# Patient Record
Sex: Female | Born: 1980
Health system: Southern US, Community
[De-identification: ages and names within clinical notes are randomized; demographics above are authoritative.]

## PROBLEM LIST (undated history)

## (undated) DIAGNOSIS — B977 Papillomavirus as the cause of diseases classified elsewhere: Secondary | ICD-10-CM

## (undated) DIAGNOSIS — N879 Dysplasia of cervix uteri, unspecified: Secondary | ICD-10-CM

## (undated) DIAGNOSIS — D72819 Decreased white blood cell count, unspecified: Secondary | ICD-10-CM

## (undated) DIAGNOSIS — N3090 Cystitis, unspecified without hematuria: Secondary | ICD-10-CM

## (undated) DIAGNOSIS — B009 Herpesviral infection, unspecified: Secondary | ICD-10-CM

## (undated) DIAGNOSIS — J45909 Unspecified asthma, uncomplicated: Secondary | ICD-10-CM

## (undated) DIAGNOSIS — F329 Major depressive disorder, single episode, unspecified: Secondary | ICD-10-CM

## (undated) DIAGNOSIS — I10 Essential (primary) hypertension: Secondary | ICD-10-CM

## (undated) DIAGNOSIS — IMO0002 Reserved for concepts with insufficient information to code with codable children: Secondary | ICD-10-CM

## (undated) DIAGNOSIS — F32A Depression, unspecified: Secondary | ICD-10-CM

## (undated) DIAGNOSIS — K5909 Other constipation: Secondary | ICD-10-CM

## (undated) DIAGNOSIS — B379 Candidiasis, unspecified: Secondary | ICD-10-CM

## (undated) DIAGNOSIS — Z8619 Personal history of other infectious and parasitic diseases: Secondary | ICD-10-CM

## (undated) DIAGNOSIS — N871 Moderate cervical dysplasia: Secondary | ICD-10-CM

## (undated) HISTORY — DX: Personal history of other infectious and parasitic diseases: Z86.19

## (undated) HISTORY — DX: Moderate cervical dysplasia: N87.1

## (undated) HISTORY — DX: Unspecified asthma, uncomplicated: J45.909

## (undated) HISTORY — DX: Decreased white blood cell count, unspecified: D72.819

## (undated) HISTORY — DX: Major depressive disorder, single episode, unspecified: F32.9

## (undated) HISTORY — DX: Herpesviral infection, unspecified: B00.9

## (undated) HISTORY — DX: Depression, unspecified: F32.A

## (undated) HISTORY — DX: Dysplasia of cervix uteri, unspecified: N87.9

## (undated) HISTORY — DX: Candidiasis, unspecified: B37.9

## (undated) HISTORY — DX: Essential (primary) hypertension: I10

## (undated) HISTORY — DX: Papillomavirus as the cause of diseases classified elsewhere: B97.7

## (undated) HISTORY — DX: Cystitis, unspecified without hematuria: N30.90

## (undated) HISTORY — DX: Other constipation: K59.09

## (undated) HISTORY — DX: Reserved for concepts with insufficient information to code with codable children: IMO0002

---
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
## 1999-06-25 ENCOUNTER — Other Ambulatory Visit: Admission: RE | Admit: 1999-06-25 | Discharge: 1999-06-25 | Payer: Self-pay | Admitting: Obstetrics and Gynecology

## 1999-07-08 ENCOUNTER — Encounter: Admission: RE | Admit: 1999-07-08 | Discharge: 1999-07-08 | Payer: Self-pay | Admitting: Family Medicine

## 1999-07-08 ENCOUNTER — Encounter: Payer: Self-pay | Admitting: Family Medicine

## 1999-08-19 ENCOUNTER — Encounter (INDEPENDENT_AMBULATORY_CARE_PROVIDER_SITE_OTHER): Payer: Self-pay

## 1999-08-19 ENCOUNTER — Other Ambulatory Visit: Admission: RE | Admit: 1999-08-19 | Discharge: 1999-08-19 | Payer: Self-pay | Admitting: Obstetrics and Gynecology

## 2001-11-04 ENCOUNTER — Other Ambulatory Visit: Admission: RE | Admit: 2001-11-04 | Discharge: 2001-11-04 | Payer: Self-pay | Admitting: Obstetrics and Gynecology

## 2002-01-10 ENCOUNTER — Other Ambulatory Visit: Admission: RE | Admit: 2002-01-10 | Discharge: 2002-01-10 | Payer: Self-pay | Admitting: Obstetrics and Gynecology

## 2003-04-25 ENCOUNTER — Emergency Department (HOSPITAL_COMMUNITY): Admission: EM | Admit: 2003-04-25 | Discharge: 2003-04-25 | Payer: Self-pay | Admitting: Emergency Medicine

## 2004-09-28 DIAGNOSIS — IMO0002 Reserved for concepts with insufficient information to code with codable children: Secondary | ICD-10-CM

## 2004-09-28 DIAGNOSIS — R87619 Unspecified abnormal cytological findings in specimens from cervix uteri: Secondary | ICD-10-CM

## 2004-09-28 HISTORY — DX: Unspecified abnormal cytological findings in specimens from cervix uteri: R87.619

## 2004-09-28 HISTORY — DX: Reserved for concepts with insufficient information to code with codable children: IMO0002

## 2007-03-07 ENCOUNTER — Inpatient Hospital Stay (HOSPITAL_COMMUNITY): Admission: AD | Admit: 2007-03-07 | Discharge: 2007-03-07 | Payer: Self-pay | Admitting: Obstetrics & Gynecology

## 2007-05-21 ENCOUNTER — Encounter: Admission: RE | Admit: 2007-05-21 | Discharge: 2007-05-21 | Payer: Self-pay | Admitting: Family Medicine

## 2008-03-31 DIAGNOSIS — B009 Herpesviral infection, unspecified: Secondary | ICD-10-CM

## 2008-03-31 HISTORY — DX: Herpesviral infection, unspecified: B00.9

## 2008-05-29 DIAGNOSIS — N871 Moderate cervical dysplasia: Secondary | ICD-10-CM

## 2008-05-29 HISTORY — DX: Moderate cervical dysplasia: N87.1

## 2008-07-28 ENCOUNTER — Ambulatory Visit (HOSPITAL_COMMUNITY): Admission: RE | Admit: 2008-07-28 | Discharge: 2008-07-28 | Payer: Self-pay | Admitting: Obstetrics and Gynecology

## 2008-07-28 DIAGNOSIS — N879 Dysplasia of cervix uteri, unspecified: Secondary | ICD-10-CM

## 2008-07-28 DIAGNOSIS — B977 Papillomavirus as the cause of diseases classified elsewhere: Secondary | ICD-10-CM

## 2008-07-28 HISTORY — PX: LASER ABLATION OF THE CERVIX: SHX1949

## 2008-07-28 HISTORY — DX: Papillomavirus as the cause of diseases classified elsewhere: B97.7

## 2008-07-28 HISTORY — DX: Dysplasia of cervix uteri, unspecified: N87.9

## 2010-02-18 ENCOUNTER — Ambulatory Visit: Payer: Self-pay | Admitting: Oncology

## 2010-02-22 LAB — CHCC SMEAR

## 2010-02-22 LAB — COMPREHENSIVE METABOLIC PANEL
ALT: 16 U/L (ref 0–35)
AST: 21 U/L (ref 0–37)
Albumin: 4.1 g/dL (ref 3.5–5.2)
Alkaline Phosphatase: 44 U/L (ref 39–117)
BUN: 13 mg/dL (ref 6–23)
CO2: 25 mEq/L (ref 19–32)
Calcium: 9.1 mg/dL (ref 8.4–10.5)
Chloride: 102 mEq/L (ref 96–112)
Creatinine, Ser: 0.75 mg/dL (ref 0.40–1.20)
Glucose, Bld: 87 mg/dL (ref 70–99)
Potassium: 3.6 mEq/L (ref 3.5–5.3)
Sodium: 135 mEq/L (ref 135–145)
Total Bilirubin: 0.3 mg/dL (ref 0.3–1.2)
Total Protein: 9.3 g/dL — ABNORMAL HIGH (ref 6.0–8.3)

## 2010-02-22 LAB — CBC WITH DIFFERENTIAL/PLATELET
BASO%: 0.8 % (ref 0.0–2.0)
Basophils Absolute: 0 10*3/uL (ref 0.0–0.1)
EOS%: 1.1 % (ref 0.0–7.0)
Eosinophils Absolute: 0 10*3/uL (ref 0.0–0.5)
HCT: 34.8 % (ref 34.8–46.6)
HGB: 11.6 g/dL (ref 11.6–15.9)
LYMPH%: 41 % (ref 14.0–49.7)
MCH: 29.3 pg (ref 25.1–34.0)
MCHC: 33.3 g/dL (ref 31.5–36.0)
MCV: 88.1 fL (ref 79.5–101.0)
MONO#: 0.4 10*3/uL (ref 0.1–0.9)
MONO%: 13.5 % (ref 0.0–14.0)
NEUT#: 1.4 10*3/uL — ABNORMAL LOW (ref 1.5–6.5)
NEUT%: 43.6 % (ref 38.4–76.8)
Platelets: 220 10*3/uL (ref 145–400)
RBC: 3.95 10*6/uL (ref 3.70–5.45)
RDW: 13.8 % (ref 11.2–14.5)
WBC: 3.2 10*3/uL — ABNORMAL LOW (ref 3.9–10.3)
lymph#: 1.3 10*3/uL (ref 0.9–3.3)

## 2010-02-22 LAB — MORPHOLOGY
PLT EST: ADEQUATE
RBC Comments: NORMAL

## 2010-02-26 LAB — FOLATE: Folate: 14.1 ng/mL

## 2010-02-26 LAB — ANTI-NUCLEAR AB-TITER (ANA TITER)

## 2010-02-26 LAB — PROTEIN ELECTROPHORESIS, SERUM
Albumin ELP: 47.8 % — ABNORMAL LOW (ref 55.8–66.1)
Alpha-2-Globulin: 8.9 % (ref 7.1–11.8)
Total Protein, Serum Electrophoresis: 9 g/dL — ABNORMAL HIGH (ref 6.0–8.3)

## 2010-02-26 LAB — VITAMIN B12: Vitamin B-12: 335 pg/mL (ref 211–911)

## 2010-02-26 LAB — IGG, IGA, IGM: IgA: 256 mg/dL (ref 68–378)

## 2010-02-26 LAB — TSH: TSH: 3.631 u[IU]/mL (ref 0.350–4.500)

## 2010-07-10 LAB — PREGNANCY, URINE: Preg Test, Ur: NEGATIVE

## 2010-07-10 LAB — URINALYSIS, ROUTINE W REFLEX MICROSCOPIC
Bilirubin Urine: NEGATIVE
Leukocytes, UA: NEGATIVE
Nitrite: NEGATIVE
Specific Gravity, Urine: 1.01 (ref 1.005–1.030)
pH: 8 (ref 5.0–8.0)

## 2010-07-10 LAB — URINE MICROSCOPIC-ADD ON

## 2010-07-10 LAB — CBC
Hemoglobin: 12.3 g/dL (ref 12.0–15.0)
MCV: 89 fL (ref 78.0–100.0)
RBC: 4.17 MIL/uL (ref 3.87–5.11)
WBC: 2.4 10*3/uL — ABNORMAL LOW (ref 4.0–10.5)

## 2010-08-13 NOTE — Op Note (Signed)
Michelle Wise, Michelle Wise             ACCOUNT NO.:  [id number]   MEDICAL RECORD NO.:  [id number]          PATIENT TYPE:  AMB   LOCATION:  SDC                           FACILITY:  [facility]   PHYSICIAN:  Janine Limbo, M.D.DATE OF BIRTH:  [birth year]   DATE OF PROCEDURE:  07/28/2008  DATE OF DISCHARGE:                               OPERATIVE REPORT   PREOPERATIVE DIAGNOSES:  1. Cervical intraepithelial neoplasia.  2. Human papillomavirus.   POSTOPERATIVE DIAGNOSES:  1. Cervical intraepithelial neoplasia.  2. Human papillomavirus   PROCEDURE:  Laser ablation of the cervix.   SURGEON:  Janine Limbo, MD   FIRST ASSISTANT:  None.   ANESTHETIC:  General.   DISPOSITION:  Ms. Hochman is a 30 year old female, para 0-0-1-0, who  presents with the above-mentioned diagnosis.  The patient understands  the indications for her surgical procedure and she also understands the  alternative treatment options.  She accepts the risks of, but not  limited to, anesthetic complications, bleeding, infections, and possible  damage to the surrounding organs.   FINDINGS:  The patient had a large nonstaining area on the exocervix.  The endocervical canal was clean.  No pelvic masses were appreciated on  examination under anesthesia.   PROCEDURE:  The patient was taken to the operating room where a general  anesthetic was given.  The patient's lower abdomen, perineum, and vagina  were prepped with multiple layers of Betadine.  The bladder was drained  of urine.  The patient was sterilely draped.  Examination under  anesthesia was performed.  The cervix was then coated with acetic acid.  Lugol solution was then placed on the cervix and in the vagina.  A large  nonstaining area was appreciated on the cervix.  The area encircled the  cervical os.  A paracervical block was then placed using 10 mL of 0.5%  Marcaine with epinephrine.  An additional 10 mL of 0.5% Marcaine with  epinephrine were injected  directly into the cervix.  The laser equipment  was then tested and was noted to be functioning properly.  A defocused  beam of the laser was used to outline the nonstaining area.  The  nonstaining area was then encircled using a focused beam of the laser.  We then used the laser to ablate the surface of the cervix removing all  nonstaining areas.  Hemostasis was noted to be adequate.  The  endocervical canal was sounded and was noted to be patent.  The cervix  was carefully inspected as well as the vagina.  There was no evidence of  any other nonstaining areas.  We felt that we were ready to terminate  our procedure.  The patient was given Toradol 30 mg intramuscularly and  30 mg intravenously at the end of the procedure.  All instruments were  removed.  The patient was awakened from her anesthetic without  difficulty and then transported to the recovery room in stable  condition.  Sponge, needle, and instrument counts were correct.  The  estimated blood loss was 5 mL.   FOLLOWUP INSTRUCTIONS:  The patient will return to  see Dr. Stefano Gaul in 2  weeks for followup examination.  She was given a copy of the  postoperative instruction sheet as prepared by the [facility] for patients who had a dilatation and curettage, but was then  modified for laser of the cervix.  She was given a prescription for  Motrin and she will take 800 mg every 8 hours as needed for mild-to-  moderate pain.  She was given a prescription for Vicodin and she will  take 1 or 2 tablets every 4 hours as needed for severe pain.  She was  given a prescription for Zofran and she will take 8 mg every 8 hours as  needed for nausea.       Janine Limbo, M.D.  Electronically Signed     AVS/MEDQ  D:  07/28/2008  T:  07/29/2008  Job:  [id number]

## 2010-08-13 NOTE — Consult Note (Signed)
NAMEVILLA, BURGIN             ACCOUNT NO.:  [id number]   MEDICAL RECORD NO.:  [id number]          PATIENT TYPE:  AMB   LOCATION:  SDC                           [facility]ACILITY:  [facility]   PHYSICIAN:  Janine Limbo, M.D.DATE OF BIRTH:  1980-12-23   DATE OF CONSULTATION:  07/27/2008  DATE OF DISCHARGE:                                 CONSULTATION   HISTORY OF PRESENT ILLNESS:  Ms. Charette is a 30 year old female, para  0 0 1 0, who presents for laser ablation of the cervix.  The patient has  been followed at the [facility]  [facility].  She had a Pap smear which showed  atypical squamous cells of undetermined significance.  The human  papilloma virus test was positive.  Colposcopy and biopsies were  performed in March 2010 and the biopsy returned showing cervical  intraepithelial neoplasia II.   OBSTETRICAL HISTORY:  The the patient has had 1 first trimester  pregnancy termination.   PAST MEDICAL HISTORY:  The patient has a history of surgical repair of  her lip because of a cleft.  This was done as a child.  The patient also  has a history of hypertension, and she is currently being treated.  She  has a history of asthma but is doing well.   DRUG ALLERGIES:  No known drug allergies.   SOCIAL HISTORY:  The patient drinks alcohol socially.  She denies  cigarette use and recreational drug uses.   REVIEW OF SYSTEMS:  Noncontributory.   FAMILY HISTORY:  Noncontributory.   PHYSICAL EXAM:  VITAL SIGNS:  Weight is 149 pounds.  Her height is 5  feet 3 inches.  HEENT:  Within normal limits.  CHEST:  Clear.  HEART:  Regular rate and rhythm.  BREASTS: Without masses.  ABDOMEN: Nontender.  EXTREMITIES:  Grossly normal.  NEUROLOGIC: Exam is grossly normal.  PELVIC: External genitalia is normal except for hypopigmentation on the  external genitalia.  Vagina is normal.  The cervix is nontender.  The  uterus is normal size,  shape and consistency.  Adnexa no masses.   ASSESSMENT:  1. Cervical intraepithelial neoplasia II.  2. Human papilloma virus   PLAN:  The patient will undergo a laser ablation of the cervix.  She  understands the indications for surgical procedure and she accepts the  risks of, but not limited to, anesthetic complications, bleeding,  infection, and possible damage to the surrounding organs.      Janine Limbo, M.D.  Electronically Signed     AVS/MEDQ  D:  07/27/2008  T:  07/27/2008  Job:  [id number]

## 2010-12-11 ENCOUNTER — Other Ambulatory Visit: Payer: Self-pay | Admitting: Oncology

## 2010-12-11 ENCOUNTER — Encounter (HOSPITAL_BASED_OUTPATIENT_CLINIC_OR_DEPARTMENT_OTHER): Payer: Federal, State, Local not specified - PPO | Admitting: Oncology

## 2010-12-11 DIAGNOSIS — D72819 Decreased white blood cell count, unspecified: Secondary | ICD-10-CM

## 2010-12-11 LAB — CBC WITH DIFFERENTIAL/PLATELET
Basophils Absolute: 0 10*3/uL (ref 0.0–0.1)
EOS%: 0.9 % (ref 0.0–7.0)
HCT: 34.5 % — ABNORMAL LOW (ref 34.8–46.6)
HGB: 11.6 g/dL (ref 11.6–15.9)
MCH: 28.9 pg (ref 25.1–34.0)
NEUT%: 63 % (ref 38.4–76.8)
lymph#: 0.7 10*3/uL — ABNORMAL LOW (ref 0.9–3.3)

## 2010-12-11 LAB — COMPREHENSIVE METABOLIC PANEL
Albumin: 4.2 g/dL (ref 3.5–5.2)
Alkaline Phosphatase: 49 U/L (ref 39–117)
BUN: 13 mg/dL (ref 6–23)
Calcium: 9.3 mg/dL (ref 8.4–10.5)
Chloride: 106 mEq/L (ref 96–112)
Glucose, Bld: 79 mg/dL (ref 70–99)
Potassium: 4.1 mEq/L (ref 3.5–5.3)

## 2011-01-06 LAB — URINALYSIS, ROUTINE W REFLEX MICROSCOPIC
Nitrite: POSITIVE — AB
Specific Gravity, Urine: 1.015
Urobilinogen, UA: 0.2

## 2011-01-06 LAB — URINE MICROSCOPIC-ADD ON

## 2011-01-31 ENCOUNTER — Encounter: Payer: Self-pay | Admitting: Oncology

## 2011-01-31 DIAGNOSIS — D72819 Decreased white blood cell count, unspecified: Secondary | ICD-10-CM | POA: Insufficient documentation

## 2011-01-31 DIAGNOSIS — F32A Depression, unspecified: Secondary | ICD-10-CM | POA: Insufficient documentation

## 2011-01-31 DIAGNOSIS — I10 Essential (primary) hypertension: Secondary | ICD-10-CM | POA: Insufficient documentation

## 2011-01-31 DIAGNOSIS — F329 Major depressive disorder, single episode, unspecified: Secondary | ICD-10-CM | POA: Insufficient documentation

## 2011-02-14 ENCOUNTER — Other Ambulatory Visit: Payer: Federal, State, Local not specified - PPO | Admitting: Lab

## 2011-02-14 ENCOUNTER — Ambulatory Visit: Payer: Federal, State, Local not specified - PPO | Admitting: Oncology

## 2011-04-14 ENCOUNTER — Other Ambulatory Visit: Payer: Federal, State, Local not specified - PPO | Admitting: Lab

## 2011-08-13 ENCOUNTER — Other Ambulatory Visit: Payer: Federal, State, Local not specified - PPO | Admitting: Lab

## 2011-08-13 ENCOUNTER — Ambulatory Visit: Payer: Federal, State, Local not specified - PPO | Admitting: Oncology

## 2011-09-01 ENCOUNTER — Telehealth: Payer: Self-pay | Admitting: Obstetrics and Gynecology

## 2011-09-01 NOTE — Telephone Encounter (Signed)
TC from pt.   States is having vag irritation with slight itching.   Had spotting 08/27/11.   Sched for eval with DR AVS 09/02/11.

## 2011-09-01 NOTE — Telephone Encounter (Signed)
Triage/epic 

## 2011-09-02 ENCOUNTER — Encounter: Payer: Self-pay | Admitting: Obstetrics and Gynecology

## 2011-09-02 ENCOUNTER — Ambulatory Visit (INDEPENDENT_AMBULATORY_CARE_PROVIDER_SITE_OTHER): Payer: Federal, State, Local not specified - PPO | Admitting: Obstetrics and Gynecology

## 2011-09-02 ENCOUNTER — Telehealth: Payer: Self-pay | Admitting: Obstetrics and Gynecology

## 2011-09-02 VITALS — BP 116/70 | Resp 16 | Ht 63.0 in | Wt 150.0 lb

## 2011-09-02 DIAGNOSIS — E663 Overweight: Secondary | ICD-10-CM

## 2011-09-02 DIAGNOSIS — N760 Acute vaginitis: Secondary | ICD-10-CM

## 2011-09-02 DIAGNOSIS — Q369 Cleft lip, unilateral: Secondary | ICD-10-CM

## 2011-09-02 DIAGNOSIS — A630 Anogenital (venereal) warts: Secondary | ICD-10-CM

## 2011-09-02 DIAGNOSIS — A499 Bacterial infection, unspecified: Secondary | ICD-10-CM

## 2011-09-02 DIAGNOSIS — B9689 Other specified bacterial agents as the cause of diseases classified elsewhere: Secondary | ICD-10-CM

## 2011-09-02 DIAGNOSIS — N871 Moderate cervical dysplasia: Secondary | ICD-10-CM

## 2011-09-02 DIAGNOSIS — N898 Other specified noninflammatory disorders of vagina: Secondary | ICD-10-CM

## 2011-09-02 DIAGNOSIS — B977 Papillomavirus as the cause of diseases classified elsewhere: Secondary | ICD-10-CM

## 2011-09-02 DIAGNOSIS — B009 Herpesviral infection, unspecified: Secondary | ICD-10-CM

## 2011-09-02 LAB — POCT WET PREP (WET MOUNT): Clue Cells Wet Prep Whiff POC: POSITIVE

## 2011-09-02 MED ORDER — FLUCONAZOLE 150 MG PO TABS: 150.0000 mg | ORAL_TABLET | Freq: Every day | ORAL | Status: AC

## 2011-09-02 MED ORDER — METRONIDAZOLE 0.75 % VA GEL: 1.0000 | Freq: Two times a day (BID) | VAGINAL | Status: AC

## 2011-09-02 NOTE — Telephone Encounter (Signed)
Michelle Wise/AVS appt today

## 2011-09-02 NOTE — Progress Notes (Signed)
Color: Yellow Odor: no Itching:yes Thin:yes Thick:no Fever:no Dyspareunia:yes Hx PID:no HX STD:yes Pelvic Pain:no Desires Gc/CT:no Desires HIV,RPR,HbsAG:no  LC  Michelle Wise is a 31 y.o. year old female,No obstetric history on file., who presents for a problem visit. She has a history of herpes virus, and HPV.  Subjective:  The patient complains of a vaginal discharge with irritation of the vulva. She complains of dyspareunia.  Objective:  BP 116/70  Resp 16  Ht 5\' 3"  (1.6 m)  Wt 150 lb (68.04 kg)  BMI 26.57 kg/m2  LMP 08/13/2011   GI: soft, non-tender; bowel sounds normal; no masses,  no organomegaly  External genitalia: normal general appearance Vaginal: normal mucosa without prolapse or lesions Cervix: normal appearance Adnexa: normal bimanual exam Uterus: normal size shape and consistency  Wet prep: Clue cells and yeast  Assessment:  Vaginal discharge Vaginosis Yeast vaginitis Dyspareunia Herpes virus 2 HPV  Plan:  MetroGel for 5 nights Diflucan 150 mg x1 GC and Chlamydia sent  Return to office in 3 week(s) for annual exam.   Leonard Schwartz M.D.  09/02/2011 7:17 PM

## 2011-09-02 NOTE — Patient Instructions (Signed)
Vaginitis Vaginitis is an infection. It causes soreness, swelling, and redness (inflammation) of the vagina. Many of these infections are sexually transmitted diseases (STDs). Having unprotected sex can cause further problems and complications such as:  Chronic pelvic pain.   Infertility.   Unwanted pregnancy.   Abortion.   Tubal pregnancy.   Infection passed on to the newborn.   Cancer.  CAUSES   Monilia. This is a yeast or fungus infection, not an STD.   Bacterial vaginosis. The normal balance of bacteria in the vagina is disrupted and is replaced by an overgrowth of certain bacteria.   Gonorrhea, chlamydia. These are bacterial infections that are STDs.   Vaginal sponges, diaphragms, and intrauterine devices.   Trichomoniasis. This is a STD infection caused by a parasite.   Viruses like herpes and human papillomavirus. Both are STDs.   Pregnancy.   Immunosuppression. This occurs with certain conditions such as HIV infection or cancer.   Using bubble bath.   Taking certain antibiotic medicines.   Sporadic recurrence can occur if you become sick.   Diabetes.   Steroids.   Allergic reaction. If you have an allergy to:   Douches.   Soaps.   Spermicides.   Condoms.   Scented tampons or vaginal sprays.  SYMPTOMS   Abnormal vaginal discharge.   Itching of the vagina.   Pain in the vagina.   Swelling of the vagina.  In some cases, there are no symptoms. TREATMENT  Treatment will vary depending on the type of infection.  Bacteria or trichomonas are usually treated with oral antibiotics and sometimes vaginal cream or suppositories.   Monilia vaginitis is usually treated with vaginal creams, suppositories, or oral antifungal pills.   Viral vaginitis has no cure. However, the symptoms of herpes (a viral vaginitis) can be treated to relieve the discomfort. Human papillomavirus has no symptoms. However, there are treatments for the diseases caused by human  papillomavirus.   With allergic vaginitis, you need to stop using the product that is causing the problem. Vaginal creams can be used to treat the symptoms.   When treating an STD, the sex partner should also be treated.  HOME CARE INSTRUCTIONS   Take all the medicines as directed by your caregiver.   Do not use scented tampons, soaps, or vaginal sprays.   Do not douche.   Tell your sex partner if you have a vaginal infection or an STD.   Do not have sexual intercourse until you have treated the vaginitis.   Practice safe sex by using condoms.  SEEK MEDICAL CARE IF:   You have abdominal pain.   Your symptoms get worse during treatment.  Document Released: 01/12/2007 Document Revised: 03/06/2011 Document Reviewed: 09/07/2008 ExitCare Patient Information 2012 [employer]. 

## 2011-09-03 ENCOUNTER — Telehealth: Payer: Self-pay

## 2011-09-03 LAB — GC/CHLAMYDIA PROBE AMP, GENITAL: Chlamydia, DNA Probe: NEGATIVE

## 2011-09-03 NOTE — Telephone Encounter (Signed)
Pt picked Rx up today. [facility] CMA

## 2011-09-03 NOTE — Telephone Encounter (Signed)
Per Michelle Wise pt called today  and wants to know why her Rx has not been called in.  I called [facility] on [address] and Rx was eprescribed yesterdays pharm stated pt has not picked up Rx and may be calling wrong pharm.[facility] CMA

## 2011-09-03 NOTE — Telephone Encounter (Signed)
Michelle Wise/AVS pt/was seen yesterday

## 2011-09-25 ENCOUNTER — Encounter: Payer: Self-pay | Admitting: Obstetrics and Gynecology

## 2011-09-25 ENCOUNTER — Ambulatory Visit (INDEPENDENT_AMBULATORY_CARE_PROVIDER_SITE_OTHER): Payer: Federal, State, Local not specified - PPO | Admitting: Obstetrics and Gynecology

## 2011-09-25 VITALS — BP 104/76 | Resp 14 | Ht 63.0 in | Wt 146.0 lb

## 2011-09-25 DIAGNOSIS — M3500 Sicca syndrome, unspecified: Secondary | ICD-10-CM

## 2011-09-25 DIAGNOSIS — Z01419 Encounter for gynecological examination (general) (routine) without abnormal findings: Secondary | ICD-10-CM

## 2011-09-25 DIAGNOSIS — Z124 Encounter for screening for malignant neoplasm of cervix: Secondary | ICD-10-CM

## 2011-09-25 NOTE — Progress Notes (Signed)
Regular Periods: yes Mammogram: no  Monthly Breast Ex.: no Exercise: yes  Tetanus < 10 years: yes Seatbelts: yes  NI. Bladder Functn.: yes Abuse at home: no  Daily BM's: no Stressful Work: yes  Healthy Diet: yes Sigmoid-Colonoscopy: n/a  Calcium: no Medical problems this year: Migraines, Sjogren's Syndrome "10/12"   LAST PAP:08/05/2010 "reactive reparative cellular changes are present / BV"  Contraception: N/A  Mammogram: N/A  PCP: Dr[facility]  PMH: Diagnosed Sjogren's Syndrome in Oct 2010  [facility]: No Changes  Last Bone Scan: N/A  ANNUAL GYNECOLOGIC EXAMINATION   GENEVER HENTGES is a 31 y.o. female, G1P1, who presents for an annual exam. See above. The patient has a history of CIN-2 that was treated with laser ablation.  Gonorrhea and Chlamydia tests were negative 2 weeks ago. She was treated for vaginosis.  Her symptoms have resolved.  The patient has a history of herpes.  She does not complain of outbreaks.  Prior Hysterectomy: No    History   Social History  . Marital Status: Single    Spouse Name: N/A    Number of Children: N/A  . Years of Education: N/A   Social History Main Topics  . Smoking status: Never Smoker   . Smokeless tobacco: None  . Alcohol Use: Yes  . Drug Use: No  . Sexually Active: Yes    Birth Control/ Protection: Condom   Other Topics Concern  . None   Social History Narrative  . None    Menstrual cycle:   LMP: Patient's last menstrual period was 09/13/2011.           Cycle: Regular, monthly with normal flow and no severe dysmenorrha  The following portions of the patient's history were reviewed and updated as appropriate: allergies, current medications, past family history, past medical history, past social history, past surgical history and problem list.  Review of Systems Pertinent items are noted in HPI. Breast:Negative for breast lump,nipple discharge or nipple retraction Gastrointestinal: Negative for  abdominal pain, change in bowel habits or rectal bleeding Urinary:negative   Objective:    BP 104/76  Resp 14  Ht 5\' 3"  (1.6 m)  Wt 146 lb (66.225 kg)  BMI 25.86 kg/m2  LMP 09/13/2011    Weight:  Wt Readings from Last 1 Encounters:  09/25/11 146 lb (66.225 kg)          BMI: Body mass index is 25.86 kg/(m^2).  General Appearance: Alert, appropriate appearance for age. No acute distress HEENT: Grossly normal Neck / Thyroid: Supple, no masses, nodes or enlargement Lungs: clear to auscultation bilaterally Back: No CVA tenderness Breast Exam: No masses or nodes.No dimpling, nipple retraction or discharge. Cardiovascular: Regular rate and rhythm. S1, S2, no murmur Gastrointestinal: Soft, non-tender, no masses or organomegaly  ++++++++++++++++++++++++++++++++++++++++++++++++++++++++  Pelvic Exam: External genitalia: normal general appearance Vaginal: normal without tenderness, induration or masses and relaxation: Yes Cervix: normal appearance Adnexa: normal bimanual exam Uterus: normal size, shape, and consistency Rectovaginal: deferred  ++++++++++++++++++++++++++++++++++++++++++++++++++++++++  Lymphatic Exam: Non-palpable nodes in neck, clavicular, axillary, or inguinal regions Neurologic: Normal speech, no tremor  Psychiatric: Alert and oriented, appropriate affect.   Wet Prep:   not applicable Urinalysis:  not applicable UPT:           Not done   Assessment:    Normal gyn exam   Overweight or obese: No   Pelvic relaxation: Yes  Sjgren syndrome  Herpes virus  Resolved vaginosis  History of CIN-2 status post laser  ablation of the cervix   Plan:    pap smear return annually or prn Contraception:condoms    Medications prescribed: none  STD screen request: none  RPR: No.   HBsAg: No.  Hepatitis C: No.  The updated Pap smear screening guidelines were discussed with the patient. The patient requested that I obtain a Pap smear: Yes.  Kegel exercises  discussed: Yes.  Proper diet and regular exercise were reviewed.  Annual mammograms recommended starting at age 34. Proper breast care was discussed.  Screening colonoscopy is recommended beginning at age 7.  Regular health maintenance was reviewed.  Sleep hygiene was discussed.  Adequate calcium and vitamin D intake was emphasized.  Mylinda Latina.D.

## 2011-09-26 DIAGNOSIS — M3500 Sicca syndrome, unspecified: Secondary | ICD-10-CM | POA: Insufficient documentation

## 2011-09-29 LAB — PAP IG W/ RFLX HPV ASCU

## 2014-01-30 ENCOUNTER — Encounter: Payer: Self-pay | Admitting: Obstetrics and Gynecology

## 2014-03-10 ENCOUNTER — Telehealth: Payer: Self-pay | Admitting: Internal Medicine

## 2014-03-10 NOTE — Telephone Encounter (Signed)
S/W PATIENT FATHER AND GAVE NP APPT FOR 12/15 @ 1:45 W/DR. MOHAMED REFERRING DR. Nickola MajorHAWKES DX- LOW WBC

## 2014-03-13 ENCOUNTER — Other Ambulatory Visit: Payer: Self-pay | Admitting: *Deleted

## 2014-03-13 ENCOUNTER — Inpatient Hospital Stay (HOSPITAL_COMMUNITY)
Admission: AD | Admit: 2014-03-13 | Discharge: 2014-03-13 | Disposition: A | Discharge status: Home / Self Care | Payer: Federal, State, Local not specified - PPO | Source: Ambulatory Visit | Attending: Obstetrics and Gynecology | Admitting: Obstetrics and Gynecology

## 2014-03-13 ENCOUNTER — Encounter (HOSPITAL_COMMUNITY): Payer: Self-pay

## 2014-03-13 DIAGNOSIS — N949 Unspecified condition associated with female genital organs and menstrual cycle: Secondary | ICD-10-CM | POA: Insufficient documentation

## 2014-03-13 DIAGNOSIS — I10 Essential (primary) hypertension: Secondary | ICD-10-CM | POA: Insufficient documentation

## 2014-03-13 DIAGNOSIS — K5900 Constipation, unspecified: Secondary | ICD-10-CM | POA: Insufficient documentation

## 2014-03-13 DIAGNOSIS — R102 Pelvic and perineal pain: Secondary | ICD-10-CM

## 2014-03-13 DIAGNOSIS — D72819 Decreased white blood cell count, unspecified: Secondary | ICD-10-CM

## 2014-03-13 DIAGNOSIS — R109 Unspecified abdominal pain: Secondary | ICD-10-CM | POA: Diagnosis present

## 2014-03-13 LAB — WET PREP, GENITAL
Clue Cells Wet Prep HPF POC: NONE SEEN
TRICH WET PREP: NONE SEEN
YEAST WET PREP: NONE SEEN

## 2014-03-13 LAB — URINALYSIS, ROUTINE W REFLEX MICROSCOPIC
Bilirubin Urine: NEGATIVE
Glucose, UA: NEGATIVE mg/dL
HGB URINE DIPSTICK: NEGATIVE
Ketones, ur: NEGATIVE mg/dL
Leukocytes, UA: NEGATIVE
NITRITE: NEGATIVE
PH: 6 (ref 5.0–8.0)
Protein, ur: NEGATIVE mg/dL
SPECIFIC GRAVITY, URINE: 1.025 (ref 1.005–1.030)
UROBILINOGEN UA: 0.2 mg/dL (ref 0.0–1.0)

## 2014-03-13 LAB — POCT PREGNANCY, URINE: PREG TEST UR: NEGATIVE

## 2014-03-13 MED ORDER — KETOROLAC TROMETHAMINE 60 MG/2ML IM SOLN
60.0000 mg | Freq: Once | INTRAMUSCULAR | Status: AC
  Administered 2014-03-13: 60 mg via INTRAMUSCULAR
  Filled 2014-03-13: qty 2

## 2014-03-13 NOTE — Discharge Instructions (Signed)
Mittelschmerz       Mittelschmerz is lower belly (abdominal) pain that happens between your periods (menstrual periods). The pain may be felt right before, during, or after your ovary releases an egg (ovulation).   HOME CARE   Only take medicines as told by your doctor. Do not take aspirin.   Write down when the pain starts. Write down how bad it is, if you had a fever with the pain, and how long the pain lasted.  GET HELP RIGHT AWAY IF:   You have more pain and medicine does not help.   You start bleeding from your vagina (more than spots of blood) and have pain.   You feel sick to your stomach (nauseous) and throw up (vomit).   You have a fever.   You feel lightheaded and pass out (faint).  MAKE SURE YOU:   Understand these instructions.   Will watch your condition.   Will get help right away if you are not doing well or get worse.  Document Released: 04/24/2004 Document Revised: 06/09/2011 Document Reviewed: 07/15/2010   ExitCare® Patient Information ©2015 [employer]. This information is not intended to replace advice given to you by your health care provider. Make sure you discuss any questions you have with your health care provider.

## 2014-03-13 NOTE — MAU Provider Note (Signed)
History     CSN: [id number]  Arrival date and time: 03/13/14 [id number]   First Provider Initiated Contact with Patient 03/13/14 0520      Chief Complaint  Patient presents with  . Abdominal Cramping   HPI  Michelle Wise is a 33 y.o. a G1P0010 who presents today with cramping. She states that around 0300 she was having intercourse. She had pain after intercourse that she thinks feels like a UTI. She denies any vaginal discharge. She states that she took aleve when the pain started, but it has not helped. LMP 02/27/14   Past Medical History  Diagnosis Date  . Depression   . Hypertension   . Leukocytopenia   . Asthma   . Chronic constipation   . HPV (human papilloma virus) infection 07/28/2008  . Cervical intraepithelial neoplasia 07/28/2008  . H/O varicella   . Herpesviral infection   . Yeast infection   . Bladder infection   . Abnormal Pap smear 2006-7  . Herpes simplex type 2 infection 2010  . CIN II (cervical intraepithelial neoplasia II) 05/2008    Past Surgical History  Procedure Laterality Date  . Laser ablation of the cervix  07/28/2008    Family History  Problem Relation Age of Onset  . Hypertension Mother     History  Substance Use Topics  . Smoking status: Never Smoker   . Smokeless tobacco: Not on file  . Alcohol Use: Yes    Allergies: No Known Allergies  Prescriptions prior to admission  Medication Sig Dispense Refill Last Dose  . naproxen sodium (ANAPROX) 220 MG tablet Take 440 mg by mouth 2 (two) times daily with a meal.   03/13/2014 at 0300  . zonisamide (ZONEGRAN) 50 MG capsule Take 50 mg by mouth daily.   03/12/2014 at 1300    ROS Physical Exam   Blood pressure 135/80, pulse 79, temperature 99.1 F (37.3 C), temperature source Oral, resp. rate 20, height 5\' 4"  (1.626 m), weight 76.567 kg (168 lb 12.8 oz), last menstrual period 02/27/2014.  Physical Exam  Nursing note and vitals reviewed. Constitutional: She is oriented to person, place,  and time. She appears well-developed and well-nourished. No distress.  Cardiovascular: Normal rate.   Respiratory: Effort normal.  GI: Soft. There is no tenderness. There is no rebound.  Genitourinary:   External: no lesion Vagina: small amount of white discharge Cervix: pink, smooth, no CMT Uterus: NSSC Adnexa: NT   Neurological: She is alert and oriented to person, place, and time.  Skin: Skin is warm and dry.  Psychiatric: She has a normal mood and affect.    MAU Course  Procedures Patient would like GC/CT testing today. However, she declines HIV testing at this time.   Results for orders placed or performed during the hospital encounter of 03/13/14 (from the past 24 hour(s))  Urinalysis, Routine w reflex microscopic     Status: None   Collection Time: 03/13/14  5:08 AM  Result Value Ref Range   Color, Urine YELLOW YELLOW   APPearance CLEAR CLEAR   Specific Gravity, Urine 1.025 1.005 - 1.030   pH 6.0 5.0 - 8.0   Glucose, UA NEGATIVE NEGATIVE mg/dL   Hgb urine dipstick NEGATIVE NEGATIVE   Bilirubin Urine NEGATIVE NEGATIVE   Ketones, ur NEGATIVE NEGATIVE mg/dL   Protein, ur NEGATIVE NEGATIVE mg/dL   Urobilinogen, UA 0.2 0.0 - 1.0 mg/dL   Nitrite NEGATIVE NEGATIVE   Leukocytes, UA NEGATIVE NEGATIVE  Pregnancy, urine POC  Status: None   Collection Time: 03/13/14  5:12 AM  Result Value Ref Range   Preg Test, Ur NEGATIVE NEGATIVE  Wet prep, genital     Status: Abnormal   Collection Time: 03/13/14  5:26 AM  Result Value Ref Range   Yeast Wet Prep HPF POC NONE SEEN NONE SEEN   Trich, Wet Prep NONE SEEN NONE SEEN   Clue Cells Wet Prep HPF POC NONE SEEN NONE SEEN   WBC, Wet Prep HPF POC FEW (A) NONE SEEN    Assessment and Plan   1. Pelvic cramping    Toradol given here D/W patient that it is likely related to being mid-cycle GC/CT pending Urine culture pending  Follow-up Information    [facility] [place].   Contact information:   [address] [facility] [place] [id number] [phone]       Tawnya CrookHogan, Heather Donovan 03/13/2014, 5:26 AM

## 2014-03-13 NOTE — MAU Note (Signed)
Lower abdominal cramping this morning. Denies vaginal bleeding or discharge. Denies n/v/d/constipation. Denies fever. LMP 02/27/14. No birth control.

## 2014-03-14 ENCOUNTER — Encounter: Payer: Self-pay | Admitting: Internal Medicine

## 2014-03-14 ENCOUNTER — Ambulatory Visit (HOSPITAL_BASED_OUTPATIENT_CLINIC_OR_DEPARTMENT_OTHER): Payer: Federal, State, Local not specified - PPO

## 2014-03-14 ENCOUNTER — Ambulatory Visit (HOSPITAL_BASED_OUTPATIENT_CLINIC_OR_DEPARTMENT_OTHER): Payer: Federal, State, Local not specified - PPO | Admitting: Internal Medicine

## 2014-03-14 ENCOUNTER — Other Ambulatory Visit: Payer: Self-pay | Admitting: Internal Medicine

## 2014-03-14 ENCOUNTER — Other Ambulatory Visit: Payer: Self-pay | Admitting: *Deleted

## 2014-03-14 ENCOUNTER — Telehealth: Payer: Self-pay | Admitting: Internal Medicine

## 2014-03-14 ENCOUNTER — Ambulatory Visit: Payer: Federal, State, Local not specified - PPO

## 2014-03-14 VITALS — BP 149/84 | HR 87 | Temp 98.9°F | Resp 18 | Ht 64.0 in | Wt 171.0 lb

## 2014-03-14 DIAGNOSIS — D72819 Decreased white blood cell count, unspecified: Secondary | ICD-10-CM

## 2014-03-14 DIAGNOSIS — D709 Neutropenia, unspecified: Secondary | ICD-10-CM

## 2014-03-14 DIAGNOSIS — D61818 Other pancytopenia: Secondary | ICD-10-CM

## 2014-03-14 LAB — CBC WITH DIFFERENTIAL/PLATELET
BASO%: 0.8 % (ref 0.0–2.0)
Basophils Absolute: 0 10*3/uL (ref 0.0–0.1)
EOS%: 1.5 % (ref 0.0–7.0)
Eosinophils Absolute: 0 10*3/uL (ref 0.0–0.5)
HCT: 38.2 % (ref 34.8–46.6)
HGB: 12.1 g/dL (ref 11.6–15.9)
LYMPH#: 1 10*3/uL (ref 0.9–3.3)
LYMPH%: 34.2 % (ref 14.0–49.7)
MCH: 28.4 pg (ref 25.1–34.0)
MCHC: 31.8 g/dL (ref 31.5–36.0)
MCV: 89.3 fL (ref 79.5–101.0)
MONO#: 0.5 10*3/uL (ref 0.1–0.9)
MONO%: 16.1 % — ABNORMAL HIGH (ref 0.0–14.0)
NEUT#: 1.3 10*3/uL — ABNORMAL LOW (ref 1.5–6.5)
NEUT%: 47.4 % (ref 38.4–76.8)
Platelets: 233 10*3/uL (ref 145–400)
RBC: 4.27 10*6/uL (ref 3.70–5.45)
RDW: 14.7 % — AB (ref 11.2–14.5)
WBC: 2.8 10*3/uL — AB (ref 3.9–10.3)

## 2014-03-14 LAB — GC/CHLAMYDIA PROBE AMP
CT Probe RNA: NEGATIVE
GC Probe RNA: NEGATIVE

## 2014-03-14 LAB — COMPREHENSIVE METABOLIC PANEL (CC13)
ALBUMIN: 3.9 g/dL (ref 3.5–5.0)
ALK PHOS: 57 U/L (ref 40–150)
ALT: 18 U/L (ref 0–55)
AST: 21 U/L (ref 5–34)
Anion Gap: 9 mEq/L (ref 3–11)
BUN: 14.3 mg/dL (ref 7.0–26.0)
CHLORIDE: 104 meq/L (ref 98–109)
CO2: 26 mEq/L (ref 22–29)
Calcium: 9.3 mg/dL (ref 8.4–10.4)
Creatinine: 0.9 mg/dL (ref 0.6–1.1)
EGFR: >90 mL/min/{1.73_m2} (ref >90)
Glucose: 70 mg/dl (ref 70–140)
POTASSIUM: 3.7 meq/L (ref 3.5–5.1)
SODIUM: 139 meq/L (ref 136–145)
TOTAL PROTEIN: 8.7 g/dL — AB (ref 6.4–8.3)
Total Bilirubin: 0.71 mg/dL (ref 0.20–1.20)

## 2014-03-14 LAB — URINE CULTURE

## 2014-03-14 LAB — TSH CHCC: TSH: 2.941 m(IU)/L (ref 0.308–3.960)

## 2014-03-14 LAB — LACTATE DEHYDROGENASE (CC13): LDH: 144 U/L (ref 125–245)

## 2014-03-14 NOTE — Progress Notes (Signed)
[place] [phone]   Fax:([phone]  CONSULT NOTE  REFERRING PHYSICIAN: Dr. Berna Bue  REASON FOR CONSULTATION:  33 years old African-American female with leukocytopenia  HPI Michelle Wise is a 33 y.o. female was past medical history significant for Sjogren's syndrome as well as seasonal allergy, depression and anxiety as well as migraine headaches and hypertension. The patient was seen by her dermatologist for evaluation of the children syndrome which was diagnosed 3 years ago and had repeat blood work recently that showed decreased white blood count to 2.4. She was referred to me today for further evaluation and recommendation regarding her persistent leukocytopenia. I reviewed her previous records the patient had leukocytopenia for more than 4 years. She was seen in the past by Dr. Lamonte Sakai at the [facility] for evaluation of the same condition in November 2011 and several studies performed at that time were unremarkable except for elevated IgG and positive ANA. She abnormal vitamin B-12 level and serum folate. The patient was followed by observation. She denied having any recurrent infection except for urinary tract infection and vaginal yeast infection. She denied having any recurrent pneumonia or sinus infection. The patient denied having any bleeding issues. She does not use any over-the-counter medication except for occasional Aleve and Miralax. She denied having any significant weight loss or night sweats. The patient has no chest pain, shortness breath, cough or hemoptysis. Her only other complaint is arthralgias. Family history significant for father with COPD, mother with hypertension and a cousin with lymphoma. The patient is single and has no children. She works at [employer]. She has a history of smoking for around 9 years and she quit 9 years ago. She drinks alcohol once a week and no history of drug abuse. HPI  Past Medical History    Diagnosis Date  . Depression   . Hypertension   . Leukocytopenia   . Asthma   . Chronic constipation   . HPV (human papilloma virus) infection 07/28/2008  . Cervical intraepithelial neoplasia 07/28/2008  . H/O varicella   . Herpesviral infection   . Yeast infection   . Bladder infection   . Abnormal Pap smear 2006-7  . Herpes simplex type 2 infection 2010  . CIN II (cervical intraepithelial neoplasia II) 05/2008    Past Surgical History  Procedure Laterality Date  . Laser ablation of the cervix  07/28/2008    Family History  Problem Relation Age of Onset  . Hypertension Mother     Social History History  Substance Use Topics  . Smoking status: Former Smoker    Quit date: 04/01/2003  . Smokeless tobacco: Not on file  . Alcohol Use: Yes    No Known Allergies  Current Outpatient Prescriptions  Medication Sig Dispense Refill  . polyethylene glycol (MIRALAX / GLYCOLAX) packet Take 17 g by mouth daily.    . naproxen sodium (ANAPROX) 220 MG tablet Take 440 mg by mouth as needed.     . zonisamide (ZONEGRAN) 50 MG capsule Take 50 mg by mouth daily.     No current facility-administered medications for this visit.    Review of Systems  Constitutional: negative Eyes: negative Ears, nose, mouth, throat, and face: negative Respiratory: negative Cardiovascular: negative Gastrointestinal: negative Genitourinary:negative Integument/breast: negative Hematologic/lymphatic: negative Musculoskeletal:positive for arthralgias Neurological: negative Behavioral/Psych: negative Endocrine: negative Allergic/Immunologic: negative  Physical Exam  IWP:YKDXI, healthy, no distress, well nourished and well developed SKIN: skin color, texture, turgor are normal, no rashes or  significant lesions HEAD: Normocephalic, No masses, lesions, tenderness or abnormalities EYES: normal, PERRLA EARS: External ears normal, Canals clear OROPHARYNX:no exudate, no erythema and lips, buccal mucosa,  and tongue normal  NECK: supple, no adenopathy, no JVD LYMPH:  no palpable lymphadenopathy, no hepatosplenomegaly BREAST:not examined LUNGS: clear to auscultation , and palpation HEART: regular rate & rhythm, no murmurs and no gallops ABDOMEN:abdomen soft, non-tender, normal bowel sounds and no masses or organomegaly BACK: Back symmetric, no curvature. EXTREMITIES:no joint deformities, effusion, or inflammation, no edema, no skin discoloration  NEURO: alert & oriented x 3 with fluent speech, no focal motor/sensory deficits  PERFORMANCE STATUS: ECOG 0  LABORATORY DATA: Lab Results  Component Value Date   WBC 2.8* 03/14/2014   HGB 12.1 03/14/2014   HCT 38.2 03/14/2014   MCV 89.3 03/14/2014   PLT 233 03/14/2014      Chemistry      Component Value Date/Time   NA 139 03/14/2014 1351   NA 138 12/11/2010 1124   K 3.7 03/14/2014 1351   K 4.1 12/11/2010 1124   CL 106 12/11/2010 1124   CO2 26 03/14/2014 1351   CO2 24 12/11/2010 1124   BUN 14.3 03/14/2014 1351   BUN 13 12/11/2010 1124   CREATININE 0.9 03/14/2014 1351   CREATININE 0.67 12/11/2010 1124      Component Value Date/Time   CALCIUM 9.3 03/14/2014 1351   CALCIUM 9.3 12/11/2010 1124   ALKPHOS 57 03/14/2014 1351   ALKPHOS 49 12/11/2010 1124   AST 21 03/14/2014 1351   AST 16 12/11/2010 1124   ALT 18 03/14/2014 1351   ALT 12 12/11/2010 1124   BILITOT 0.71 03/14/2014 1351   BILITOT 0.4 12/11/2010 1124       RADIOGRAPHIC STUDIES: No results found.  ASSESSMENT: This is a very pleasant 33 years old African-American female with persistent leukocytopenia and neutropenia that has been going on for years, likely to be either ethnic in origin or secondary to her rheumatologic disorder but other etiologies cannot be ruled out at this point.  PLAN: I had a lengthy discussion with the patient today about her current condition and further investigation to rule out any other abnormalities causing her leukocytopenia and  neutropenia.  The patient had extensive workup performed in the past by a previous hematologist at the [facility] that showed no significant abnormalities except for the positive ANA and elevated IgG secondary to her rheumatologic disorder. I recommended for the patient to proceed with a bone marrow biopsy and aspirate to rule out any other abnormalities. If she has no abnormalities on the bone marrow biopsy the patient can be followed by observation and close monitoring by her primary care physician and rheumatologist. I will see her back for follow-up visit in one month's for evaluation and discussion of the biopsy results She was advised to call immediately if she has any concerning symptoms in the interval. The patient voices understanding of current disease status and treatment options and is in agreement with the current care plan.  All questions were answered. The patient knows to call the clinic with any problems, questions or concerns. We can certainly see the patient much sooner if necessary.  Thank you so much for allowing me to participate in the care of Michelle Wise. I will continue to follow up the patient with you and assist in her care.  I spent 40 minutes counseling the patient face to face. The total time spent in the appointment was 60 minutes.  Disclaimer:  This note was dictated with voice recognition software. Similar sounding words can inadvertently be transcribed and may not be corrected upon review.   Thatcher Doberstein K. 03/14/2014, 3:15 PM

## 2014-03-14 NOTE — Telephone Encounter (Signed)
Pt confirmed labs/ov per 12/15 POF, gave pt AVS.... KJ  °

## 2014-03-15 LAB — HEPATITIS PANEL, ACUTE
HCV Ab: NEGATIVE
HEP A IGM: NONREACTIVE
Hep B C IgM: NONREACTIVE
Hepatitis B Surface Ag: NEGATIVE

## 2014-03-15 LAB — RHEUMATOID FACTOR: RHEUMATOID FACTOR: 23 [IU]/mL — AB (ref <=14)

## 2014-03-15 LAB — ANA: Anti Nuclear Antibody(ANA): POSITIVE — AB

## 2014-03-15 LAB — ANTI-NUCLEAR AB-TITER (ANA TITER): ANA Titer 1: 1:80 {titer} — ABNORMAL HIGH

## 2014-03-28 ENCOUNTER — Ambulatory Visit (HOSPITAL_COMMUNITY): Payer: Federal, State, Local not specified - PPO

## 2014-03-28 ENCOUNTER — Other Ambulatory Visit (HOSPITAL_COMMUNITY): Payer: Federal, State, Local not specified - PPO

## 2014-03-30 ENCOUNTER — Other Ambulatory Visit: Payer: Self-pay | Admitting: Radiology

## 2014-04-02 ENCOUNTER — Other Ambulatory Visit: Payer: Self-pay | Admitting: Radiology

## 2014-04-03 ENCOUNTER — Ambulatory Visit (HOSPITAL_COMMUNITY)
Admission: RE | Admit: 2014-04-03 | Discharge: 2014-04-03 | Disposition: A | Discharge status: Home / Self Care | Payer: Federal, State, Local not specified - PPO | Source: Ambulatory Visit | Attending: Internal Medicine | Admitting: Internal Medicine

## 2014-04-03 ENCOUNTER — Encounter (HOSPITAL_COMMUNITY): Payer: Self-pay

## 2014-04-03 DIAGNOSIS — Z87891 Personal history of nicotine dependence: Secondary | ICD-10-CM | POA: Diagnosis not present

## 2014-04-03 DIAGNOSIS — I10 Essential (primary) hypertension: Secondary | ICD-10-CM | POA: Insufficient documentation

## 2014-04-03 DIAGNOSIS — J45909 Unspecified asthma, uncomplicated: Secondary | ICD-10-CM | POA: Insufficient documentation

## 2014-04-03 DIAGNOSIS — D649 Anemia, unspecified: Secondary | ICD-10-CM | POA: Insufficient documentation

## 2014-04-03 DIAGNOSIS — M3500 Sicca syndrome, unspecified: Secondary | ICD-10-CM | POA: Insufficient documentation

## 2014-04-03 DIAGNOSIS — Z79899 Other long term (current) drug therapy: Secondary | ICD-10-CM | POA: Diagnosis not present

## 2014-04-03 DIAGNOSIS — D61818 Other pancytopenia: Secondary | ICD-10-CM

## 2014-04-03 DIAGNOSIS — D709 Neutropenia, unspecified: Secondary | ICD-10-CM | POA: Diagnosis not present

## 2014-04-03 DIAGNOSIS — Z791 Long term (current) use of non-steroidal anti-inflammatories (NSAID): Secondary | ICD-10-CM | POA: Diagnosis not present

## 2014-04-03 LAB — CBC WITH DIFFERENTIAL/PLATELET
BASOS ABS: 0 10*3/uL (ref 0.0–0.1)
Basophils Relative: 1 % (ref 0–1)
Eosinophils Absolute: 0 10*3/uL (ref 0.0–0.7)
Eosinophils Relative: 2 % (ref 0–5)
HCT: 36.2 % (ref 36.0–46.0)
Hemoglobin: 11.5 g/dL — ABNORMAL LOW (ref 12.0–15.0)
LYMPHS PCT: 54 % — AB (ref 12–46)
Lymphs Abs: 1.3 10*3/uL (ref 0.7–4.0)
MCH: 28.5 pg (ref 26.0–34.0)
MCHC: 31.8 g/dL (ref 30.0–36.0)
MCV: 89.6 fL (ref 78.0–100.0)
MONOS PCT: 13 % — AB (ref 3–12)
Monocytes Absolute: 0.3 10*3/uL (ref 0.1–1.0)
Neutro Abs: 0.7 10*3/uL — ABNORMAL LOW (ref 1.7–7.7)
Neutrophils Relative %: 30 % — ABNORMAL LOW (ref 43–77)
PLATELETS: 273 10*3/uL (ref 150–400)
RBC: 4.04 MIL/uL (ref 3.87–5.11)
RDW: 14.4 % (ref 11.5–15.5)
WBC: 2.3 10*3/uL — AB (ref 4.0–10.5)

## 2014-04-03 LAB — PROTIME-INR
INR: 1.05 (ref 0.00–1.49)
Prothrombin Time: 13.8 seconds (ref 11.6–15.2)

## 2014-04-03 LAB — APTT: APTT: 28 s (ref 24–37)

## 2014-04-03 LAB — BONE MARROW EXAM

## 2014-04-03 MED ORDER — SODIUM CHLORIDE 0.9 % IV SOLN
INTRAVENOUS | Status: DC
  Administered 2014-04-03: 09:00:00 via INTRAVENOUS

## 2014-04-03 MED ORDER — MIDAZOLAM HCL 2 MG/2ML IJ SOLN
INTRAMUSCULAR | Status: AC
  Filled 2014-04-03: qty 6

## 2014-04-03 MED ORDER — FENTANYL CITRATE 0.05 MG/ML IJ SOLN
INTRAMUSCULAR | Status: AC | PRN
  Administered 2014-04-03: 50 ug via INTRAVENOUS
  Administered 2014-04-03: 25 ug via INTRAVENOUS

## 2014-04-03 MED ORDER — FENTANYL CITRATE 0.05 MG/ML IJ SOLN
INTRAMUSCULAR | Status: AC
  Filled 2014-04-03: qty 6

## 2014-04-03 MED ORDER — HYDROCODONE-ACETAMINOPHEN 5-325 MG PO TABS: 1.0000 | ORAL_TABLET | ORAL | Status: DC | PRN

## 2014-04-03 MED ORDER — MIDAZOLAM HCL 2 MG/2ML IJ SOLN
INTRAMUSCULAR | Status: AC | PRN
  Administered 2014-04-03 (×2): 1 mg via INTRAVENOUS

## 2014-04-03 NOTE — H&P (Signed)
Chief Complaint: "I'm having a bone marrow biopsy"  Referring Physician(s): Mohamed,Mohamed  History of Present Illness: Michelle Wise is a 34 y.o. female with history of Sjogren's syndrome and persistent leukopenia/neutropenia who presents today for CT guided bone marrow biopsy for further evaluation.   Past Medical History  Diagnosis Date  . Depression   . Hypertension   . Leukocytopenia   . Asthma   . Chronic constipation   . HPV (human papilloma virus) infection 07/28/2008  . Cervical intraepithelial neoplasia 07/28/2008  . H/O varicella   . Herpesviral infection   . Yeast infection   . Bladder infection   . Abnormal Pap smear 2006-7  . Herpes simplex type 2 infection 2010  . CIN II (cervical intraepithelial neoplasia II) 05/2008    Past Surgical History  Procedure Laterality Date  . Laser ablation of the cervix  07/28/2008    Allergies: Review of patient's allergies indicates no known allergies.  Medications: Prior to Admission medications   Medication Sig Start Date End Date Taking? Authorizing Provider  naproxen sodium (ANAPROX) 220 MG tablet Take 440 mg by mouth 2 (two) times daily as needed (FOR PAIN).    Yes Historical Provider, MD  polyethylene glycol (MIRALAX / GLYCOLAX) packet Take 17 g by mouth daily.   Yes Historical Provider, MD  zonisamide (ZONEGRAN) 50 MG capsule Take 50 mg by mouth daily.   Yes Historical Provider, MD    Family History  Problem Relation Age of Onset  . Hypertension Mother     History   Social History  . Marital Status: Single    Spouse Name: N/A    Number of Children: N/A  . Years of Education: N/A   Social History Main Topics  . Smoking status: Former Smoker    Quit date: 04/01/2003  . Smokeless tobacco: None  . Alcohol Use: Yes  . Drug Use: No  . Sexual Activity: Yes    Birth Control/ Protection: Condom   Other Topics Concern  . None   Social History Narrative      Review of Systems  Constitutional:  Negative for fever and chills.  Respiratory: Negative for cough and shortness of breath.   Cardiovascular: Negative for chest pain.  Gastrointestinal: Negative for nausea, abdominal pain and blood in stool.  Genitourinary: Negative for dysuria and hematuria.  Musculoskeletal: Positive for arthralgias. Negative for back pain.  Neurological: Negative for headaches.    Vital Signs: BP 154/100 mmHg  Pulse 75  Temp(Src) 98.4 F (36.9 C) (Oral)  Resp 18  SpO2 100%  LMP 03/29/2014  Physical Exam  Constitutional: She is oriented to person, place, and time. She appears well-developed and well-nourished.  Cardiovascular: Normal rate and regular rhythm.   Pulmonary/Chest: Effort normal and breath sounds normal.  Abdominal: Soft. Bowel sounds are normal. There is no tenderness.  Musculoskeletal: Normal range of motion. She exhibits no edema.  Neurological: She is alert and oriented to person, place, and time.    Imaging: No results found.  Labs:  CBC:  Recent Labs  03/14/14 1350  WBC 2.8*  HGB 12.1  HCT 38.2  PLT 233    COAGS:  Recent Labs  04/03/14 0915  INR 1.05  APTT 28    BMP:  Recent Labs  03/14/14 1351  NA 139  K 3.7  CO2 26  GLUCOSE 70  BUN 14.3  CALCIUM 9.3  CREATININE 0.9    LIVER FUNCTION TESTS:  Recent Labs  03/14/14 1351  BILITOT 0.71  AST 21  ALT 18  ALKPHOS 57  PROT 8.7*  ALBUMIN 3.9    TUMOR MARKERS: No results for input(s): AFPTM, CEA, CA199, CHROMGRNA in the last 8760 hours.  Assessment and Plan: Michelle Wise is a 34 y.o. female with history of Sjogren's syndrome and persistent leukopenia/neutropenia who presents today for CT guided bone marrow biopsy for further evaluation. Details/risks of procedure d/w pt/father with their understanding and consent.      Signed: Autumn Messing 04/03/2014, 10:06 AM

## 2014-04-03 NOTE — Discharge Instructions (Signed)
Bone Marrow Aspiration, Bone Marrow Biopsy °Care After °Read the instructions outlined below and refer to this sheet in the next few weeks. These discharge instructions provide you with general information on caring for yourself after you leave the hospital. Your caregiver may also give you specific instructions. While your treatment has been planned according to the most current medical practices available, unavoidable complications occasionally occur. If you have any problems or questions after discharge, call your caregiver. °FINDING OUT THE RESULTS OF YOUR TEST °Not all test results are available during your visit. If your test results are not back during the visit, make an appointment with your caregiver to find out the results. Do not assume everything is normal if you have not heard from your caregiver or the medical facility. It is important for you to follow up on all of your test results.  °HOME CARE INSTRUCTIONS  °You have had sedation and may be sleepy or dizzy. Your thinking may not be as clear as usual. For the next 24 hours: °· Only take over-the-counter or prescription medicines for pain, discomfort, and or fever as directed by your caregiver. °· Do not drink alcohol. °· Do not smoke. °· Do not drive. °· Do not make important legal decisions. °· Do not operate heavy machinery. °· Do not care for small children by yourself. °· Keep your dressing clean and dry. You may replace dressing with a bandage after 24 hours. °· You may take a bath or shower after 24 hours. °· Use an ice pack for 20 minutes every 2 hours while awake for pain as needed. °SEEK MEDICAL CARE IF:  °· There is redness, swelling, or increasing pain at the biopsy site. °· There is pus coming from the biopsy site. °· There is drainage from a biopsy site lasting longer than one day. °· An unexplained oral temperature above 102° F (38.9° C) develops. °SEEK IMMEDIATE MEDICAL CARE IF:  °· You develop a rash. °· You have difficulty  breathing. °· You develop any reaction or side effects to medications given. °Document Released: 10/04/2004 Document Revised: 06/09/2011 Document Reviewed: 03/14/2008 °ExitCare® Patient Information ©2015 [employer]. This information is not intended to replace advice given to you by your health care provider. Make sure you discuss any questions you have with your health care provider. °Conscious Sedation, Adult, Care After °Refer to this sheet in the next few weeks. These instructions provide you with information on caring for yourself after your procedure. Your health care provider may also give you more specific instructions. Your treatment has been planned according to current medical practices, but problems sometimes occur. Call your health care provider if you have any problems or questions after your procedure. °WHAT TO EXPECT AFTER THE PROCEDURE  °After your procedure: °· You may feel sleepy, clumsy, and have poor balance for several hours. °· Vomiting may occur if you eat too soon after the procedure. °HOME CARE INSTRUCTIONS °· Do not participate in any activities where you could become injured for at least 24 hours. Do not: °¨ Drive. °¨ Swim. °¨ Ride a bicycle. °¨ Operate heavy machinery. °¨ Cook. °¨ Use power tools. °¨ Climb ladders. °¨ Work from a high place. °· Do not make important decisions or sign legal documents until you are improved. °· If you vomit, drink water, juice, or soup when you can drink without vomiting. Make sure you have little or no nausea before eating solid foods. °· Only take over-the-counter or prescription medicines for pain, discomfort, or fever   as directed by your health care provider. °· Make sure you and your family fully understand everything about the medicines given to you, including what side effects may occur. °· You should not drink alcohol, take sleeping pills, or take medicines that cause drowsiness for at least 24 hours. °· If you smoke, do not smoke without  supervision. °· If you are feeling better, you may resume normal activities 24 hours after you were sedated. °· Keep all appointments with your health care provider. °SEEK MEDICAL CARE IF: °· Your skin is pale or bluish in color. °· You continue to feel nauseous or vomit. °· Your pain is getting worse and is not helped by medicine. °· You have bleeding or swelling. °· You are still sleepy or feeling clumsy after 24 hours. °SEEK IMMEDIATE MEDICAL CARE IF: °· You develop a rash. °· You have difficulty breathing. °· You develop any type of allergic problem. °· You have a fever. °MAKE SURE YOU: °· Understand these instructions. °· Will watch your condition. °· Will get help right away if you are not doing well or get worse. °Document Released: 01/05/2013 Document Reviewed: 01/05/2013 °ExitCare® Patient Information ©2015 [employer]. This information is not intended to replace advice given to you by your health care provider. Make sure you discuss any questions you have with your health care provider. ° °

## 2014-04-03 NOTE — Procedures (Signed)
Successful CT guided bone marrow biopsy.  2 aspirates and 1 core obtained from right iliac bone.  No immediate complication.

## 2014-04-06 ENCOUNTER — Encounter: Payer: Self-pay | Admitting: Internal Medicine

## 2014-04-07 ENCOUNTER — Telehealth: Payer: Self-pay | Admitting: Medical Oncology

## 2014-04-07 NOTE — Telephone Encounter (Signed)
Requests results of bone marrow . I told her dr Arbutus PedMohamed will go over it at her appt.

## 2014-04-10 LAB — CHROMOSOME ANALYSIS, BONE MARROW

## 2014-04-17 ENCOUNTER — Encounter: Payer: Self-pay | Admitting: Internal Medicine

## 2014-04-17 ENCOUNTER — Telehealth: Payer: Self-pay | Admitting: Internal Medicine

## 2014-04-17 ENCOUNTER — Other Ambulatory Visit: Payer: Self-pay | Admitting: Medical Oncology

## 2014-04-17 ENCOUNTER — Other Ambulatory Visit (HOSPITAL_BASED_OUTPATIENT_CLINIC_OR_DEPARTMENT_OTHER): Payer: Federal, State, Local not specified - PPO

## 2014-04-17 ENCOUNTER — Ambulatory Visit (HOSPITAL_BASED_OUTPATIENT_CLINIC_OR_DEPARTMENT_OTHER): Payer: Federal, State, Local not specified - PPO | Admitting: Internal Medicine

## 2014-04-17 VITALS — BP 126/72 | HR 80 | Temp 97.9°F | Resp 18 | Ht 64.0 in | Wt 173.3 lb

## 2014-04-17 DIAGNOSIS — D709 Neutropenia, unspecified: Secondary | ICD-10-CM

## 2014-04-17 DIAGNOSIS — D72819 Decreased white blood cell count, unspecified: Secondary | ICD-10-CM

## 2014-04-17 DIAGNOSIS — D61818 Other pancytopenia: Secondary | ICD-10-CM

## 2014-04-17 LAB — CBC WITH DIFFERENTIAL/PLATELET
BASO%: 0.3 % (ref 0.0–2.0)
Basophils Absolute: 0 10*3/uL (ref 0.0–0.1)
EOS%: 2 % (ref 0.0–7.0)
Eosinophils Absolute: 0.1 10*3/uL (ref 0.0–0.5)
HCT: 38.9 % (ref 34.8–46.6)
HGB: 12.5 g/dL (ref 11.6–15.9)
LYMPH#: 1.2 10*3/uL (ref 0.9–3.3)
LYMPH%: 44.6 % (ref 14.0–49.7)
MCH: 29.1 pg (ref 25.1–34.0)
MCHC: 32.2 g/dL (ref 31.5–36.0)
MCV: 90.2 fL (ref 79.5–101.0)
MONO#: 0.4 10*3/uL (ref 0.1–0.9)
MONO%: 14.2 % — ABNORMAL HIGH (ref 0.0–14.0)
NEUT%: 38.9 % (ref 38.4–76.8)
NEUTROS ABS: 1.1 10*3/uL — AB (ref 1.5–6.5)
Platelets: 198 10*3/uL (ref 145–400)
RBC: 4.31 10*6/uL (ref 3.70–5.45)
RDW: 15.2 % — ABNORMAL HIGH (ref 11.2–14.5)
WBC: 2.7 10*3/uL — AB (ref 3.9–10.3)

## 2014-04-17 NOTE — Telephone Encounter (Signed)
Pt confirmed labs/ov per 01/18 POF, gave pt AVS..... KJ ° °

## 2014-04-17 NOTE — Progress Notes (Signed)
[facility] Telephone:(336) [phone]   Fax:(336) [phone]  OFFICE PROGRESS NOTE  Lilian Coma, MD [address] [place] [address]  DIAGNOSIS: Mild Leukocytopenia and neutropenia of unclear etiology, likely ethnic in origin  PRIOR THERAPY: None  CURRENT THERAPY: Observation  INTERVAL HISTORY: Michelle Wise 34 y.o. female returns to the clinic today for follow-up visit. The patient is feeling fine today with no specific complaints. She recently underwent a bone marrow biopsy and aspirate and she is here for evaluation and discussion of her biopsy results. She denied having any significant fatigue or weakness. Denied having any recurrent infection. She has no fever or chills, no nausea or vomiting. The patient denied having any significant chest pain, shortness breath, cough or hemoptysis.  MEDICAL HISTORY: Past Medical History  Diagnosis Date  . Depression   . Hypertension   . Leukocytopenia   . Asthma   . Chronic constipation   . HPV (human papilloma virus) infection 07/28/2008  . Cervical intraepithelial neoplasia 07/28/2008  . H/O varicella   . Herpesviral infection   . Yeast infection   . Bladder infection   . Abnormal Pap smear 2006-7  . Herpes simplex type 2 infection 2010  . CIN II (cervical intraepithelial neoplasia II) 05/2008    ALLERGIES:  has No Known Allergies.  MEDICATIONS:  Current Outpatient Prescriptions  Medication Sig Dispense Refill  . polyethylene glycol (MIRALAX / GLYCOLAX) packet Take 17 g by mouth daily.    Marland Kitchen zonisamide (ZONEGRAN) 50 MG capsule Take 50 mg by mouth daily.    . naproxen sodium (ANAPROX) 220 MG tablet Take 440 mg by mouth 2 (two) times daily as needed (FOR PAIN).     Marland Kitchen valACYclovir (VALTREX) 500 MG tablet Take 500 mg by mouth daily.  11   No current facility-administered medications for this visit.    SURGICAL HISTORY:  Past Surgical History  Procedure Laterality Date  . Laser  ablation of the cervix  07/28/2008    REVIEW OF SYSTEMS:  A comprehensive review of systems was negative.   PHYSICAL EXAMINATION: General appearance: alert, cooperative and no distress Head: Normocephalic, without obvious abnormality, atraumatic Neck: no adenopathy, no JVD, supple, symmetrical, trachea midline and thyroid not enlarged, symmetric, no tenderness/mass/nodules Lymph nodes: Cervical, supraclavicular, and axillary nodes normal. Resp: clear to auscultation bilaterally Back: symmetric, no curvature. ROM normal. No CVA tenderness. Cardio: regular rate and rhythm, S1, S2 normal, no murmur, click, rub or gallop GI: soft, non-tender; bowel sounds normal; no masses,  no organomegaly Extremities: extremities normal, atraumatic, no cyanosis or edema  ECOG PERFORMANCE STATUS: 0 - Asymptomatic  Blood pressure 126/72, pulse 80, temperature 97.9 F (36.6 C), temperature source Oral, resp. rate 18, height $RemoveBe'5\' 4"'pDDHvGmyP$  (1.626 m), weight 173 lb 4.8 oz (78.608 kg), last menstrual period 03/29/2014, SpO2 100 %.  LABORATORY DATA: Lab Results  Component Value Date   WBC 2.7* 04/17/2014   HGB 12.5 04/17/2014   HCT 38.9 04/17/2014   MCV 90.2 04/17/2014   PLT 198 04/17/2014      Chemistry      Component Value Date/Time   NA 139 03/14/2014 1351   NA 138 12/11/2010 1124   K 3.7 03/14/2014 1351   K 4.1 12/11/2010 1124   CL 106 12/11/2010 1124   CO2 26 03/14/2014 1351   CO2 24 12/11/2010 1124   BUN 14.3 03/14/2014 1351   BUN 13 12/11/2010 1124   CREATININE 0.9 03/14/2014 1351  CREATININE 0.67 12/11/2010 1124      Component Value Date/Time   CALCIUM 9.3 03/14/2014 1351   CALCIUM 9.3 12/11/2010 1124   ALKPHOS 57 03/14/2014 1351   ALKPHOS 49 12/11/2010 1124   AST 21 03/14/2014 1351   AST 16 12/11/2010 1124   ALT 18 03/14/2014 1351   ALT 12 12/11/2010 1124   BILITOT 0.71 03/14/2014 1351   BILITOT 0.4 12/11/2010 1124       RADIOGRAPHIC STUDIES: Ct Biopsy  04/03/2014   CLINICAL DATA:   34 year old with Sjogren syndrome and leukocytopenia and neutropenia.  EXAM: CT GUIDED BONE MARROW ASPIRATES AND BIOPSY  Physician: Stephan Minister. Anselm Pancoast, MD  MEDICATIONS: 2 mg versed, 75 mcg fentanyl. A radiology nurse monitored the patient for moderate sedation.  ANESTHESIA/SEDATION: Sedation time: 14 min  PROCEDURE: The procedure was explained to the patient. The risks and benefits of the procedure were discussed and the patient's questions were addressed. Informed consent was obtained from the patient. The patient was placed prone on CT scan. Images of the pelvis were obtained. The right side of back was prepped and draped in sterile fashion. The skin and right posterior ilium were anesthetized with 1% lidocaine. 11 gauge bone needle was directed into the right ilium with CT guidance. Two aspirates and 1 core biopsy obtained.  FINDINGS: Bone needle directed into the posterior right ilium.  COMPLICATIONS: None  IMPRESSION: CT guided bone marrow aspirates and core biopsy.   Electronically Signed   By: Markus Daft M.D.   On: 04/03/2014 13:25    ASSESSMENT AND PLAN: This is a very pleasant 34 years old African-American female with mild leukocytopenia and neutropenia of unclear etiology but it could BS neck in origin. The recent bone marrow biopsy and aspirate showed no concerning findings. I discussed the biopsy results with the patient and gave her a copy of her report. Her CBC today showed mild improvement in her total white blood count and absolute neutrophil count. I recommended for her to continue on observation with repeat CBC in 3 months. She was advised to call immediately if she has any concerning symptoms in the interval. The patient voices understanding of current disease status and treatment options and is in agreement with the current care plan.  All questions were answered. The patient knows to call the clinic with any problems, questions or concerns. We can certainly see the patient much sooner if  necessary.  Disclaimer: This note was dictated with voice recognition software. Similar sounding words can inadvertently be transcribed and may not be corrected upon review.

## 2014-04-20 ENCOUNTER — Encounter (HOSPITAL_COMMUNITY): Payer: Self-pay

## 2014-07-17 ENCOUNTER — Ambulatory Visit: Payer: Federal, State, Local not specified - PPO | Admitting: Internal Medicine

## 2014-07-17 ENCOUNTER — Other Ambulatory Visit: Payer: Federal, State, Local not specified - PPO

## 2015-05-09 ENCOUNTER — Other Ambulatory Visit: Payer: Self-pay | Admitting: Family Medicine

## 2015-05-09 DIAGNOSIS — E049 Nontoxic goiter, unspecified: Secondary | ICD-10-CM

## 2015-05-21 ENCOUNTER — Ambulatory Visit
Admission: RE | Admit: 2015-05-21 | Discharge: 2015-05-21 | Disposition: A | Discharge status: Home / Self Care | Payer: Federal, State, Local not specified - PPO | Source: Ambulatory Visit | Attending: Family Medicine | Admitting: Family Medicine

## 2015-05-21 DIAGNOSIS — E049 Nontoxic goiter, unspecified: Secondary | ICD-10-CM

## 2015-11-28 ENCOUNTER — Ambulatory Visit (INDEPENDENT_AMBULATORY_CARE_PROVIDER_SITE_OTHER): Payer: Federal, State, Local not specified - PPO | Admitting: Physician Assistant

## 2015-11-28 VITALS — BP 150/100 | HR 88 | Temp 98.1°F | Resp 17 | Ht 64.0 in | Wt 162.0 lb

## 2015-11-28 DIAGNOSIS — N760 Acute vaginitis: Secondary | ICD-10-CM

## 2015-11-28 DIAGNOSIS — A499 Bacterial infection, unspecified: Secondary | ICD-10-CM | POA: Diagnosis not present

## 2015-11-28 DIAGNOSIS — N898 Other specified noninflammatory disorders of vagina: Secondary | ICD-10-CM

## 2015-11-28 DIAGNOSIS — A549 Gonococcal infection, unspecified: Secondary | ICD-10-CM | POA: Diagnosis not present

## 2015-11-28 DIAGNOSIS — B379 Candidiasis, unspecified: Secondary | ICD-10-CM | POA: Diagnosis not present

## 2015-11-28 DIAGNOSIS — T3695XA Adverse effect of unspecified systemic antibiotic, initial encounter: Secondary | ICD-10-CM

## 2015-11-28 DIAGNOSIS — B9689 Other specified bacterial agents as the cause of diseases classified elsewhere: Secondary | ICD-10-CM

## 2015-11-28 LAB — POCT WET + KOH PREP
Trich by wet prep: ABSENT
Yeast by KOH: ABSENT
Yeast by wet prep: ABSENT

## 2015-11-28 LAB — POC MICROSCOPIC URINALYSIS (UMFC): MUCUS RE: ABSENT

## 2015-11-28 LAB — POCT URINALYSIS DIP (MANUAL ENTRY)
Bilirubin, UA: NEGATIVE
GLUCOSE UA: NEGATIVE
Ketones, POC UA: NEGATIVE
Leukocytes, UA: NEGATIVE
NITRITE UA: NEGATIVE
Protein Ur, POC: NEGATIVE
RBC UA: NEGATIVE
Spec Grav, UA: 1.01
Urobilinogen, UA: 0.2
pH, UA: 7

## 2015-11-28 LAB — POCT URINE PREGNANCY: PREG TEST UR: NEGATIVE

## 2015-11-28 MED ORDER — METRONIDAZOLE 500 MG PO TABS: 500.0000 mg | ORAL_TABLET | Freq: Two times a day (BID) | ORAL | 0 refills | Status: DC

## 2015-11-28 MED ORDER — FLUCONAZOLE 150 MG PO TABS: 150.0000 mg | ORAL_TABLET | Freq: Once | ORAL | 0 refills | Status: AC

## 2015-11-28 MED ORDER — METRONIDAZOLE 0.75 % VA GEL: 1.0000 | Freq: Every day | VAGINAL | 0 refills | Status: AC

## 2015-11-28 NOTE — Patient Instructions (Addendum)
   Bacterial Vaginosis Bacterial vaginosis is an infection of the vagina. It happens when too many germs (bacteria) grow in the vagina. Having this infection puts you at risk for getting other infections from sex. Treating this infection can help lower your risk for other infections, such as:   Chlamydia.  Gonorrhea.  HIV.  Herpes. HOME CARE  Take your medicine as told by your doctor.  Finish your medicine even if you start to feel better.  Tell your sex partner that you have an infection. They should see their doctor for treatment.  During treatment:  Avoid sex or use condoms correctly.  Do not douche.  Do not drink alcohol unless your doctor tells you it is ok.  Do not breastfeed unless your doctor tells you it is ok. GET HELP IF:  You are not getting better after 3 days of treatment.  You have more grey fluid (discharge) coming from your vagina than before.  You have more pain than before.  You have a fever. MAKE SURE YOU:   Understand these instructions.  Will watch your condition.  Will get help right away if you are not doing well or get worse.   This information is not intended to replace advice given to you by your health care provider. Make sure you discuss any questions you have with your health care provider.   Document Released: 12/25/2007 Document Revised: 04/07/2014 Document Reviewed: 10/27/2012 Elsevier Interactive Patient Education 2016 Elsevier Inc.    IF you received an x-ray today, you will receive an invoice from [facility]. Please contact  Radiology at [phone] with questions or concerns regarding your invoice.   IF you received labwork today, you will receive an invoice from [facility]. Please contact [facility] at [phone] with questions or concerns regarding your invoice.   Our billing staff will not be able to assist you with questions regarding bills from these companies.  You  will be contacted with the lab results as soon as they are available. The fastest way to get your results is to activate your My Chart account. Instructions are located on the last page of this paperwork. If you have not heard from us regarding the results in 2 weeks, please contact this office.      

## 2015-11-28 NOTE — Progress Notes (Signed)
Patient ID: Michelle Wise, female   DOB: 1980/12/01, 35 y.o.   MRN: [id number] [facility] [address], [place] [address] [phone]  Date:  11/28/2015   Name:  [name]   DOB:  22-Jul-1980   MRN:  [id number]  PCP:  Emeterio Reeve, MD   By signing my name below, I, Charline Bills, attest that this documentation has been prepared under the direction and in the presence of Trena Platt, PA-C Electronically Signed: Charline Bills, ED Scribe 11/28/2015 at 9:34 AM.  Chief Complaint  Patient presents with   Vaginal Discharge    "fews days on and off"   Vaginal Itching   History of Present Illness:  Michelle Wise is a 35 y.o. female patient who presents to [facility] complaining of intermittent white vaginal discharge for the past few days. Pt describes the vaginal discharge as non-odorous and similar to cottage cheese. She reports associated symptoms of vaginal itching and urinary frequency for the past few days as well. Pt reports unprotected sexual intercourse with the same partner for the past 2 years. She requests STD screening for gonorrhea and chlamydia at this visit. She states that she had a negative HIV and syphilis screening approximately 3 months ago and does not want this repeated today. Pt denies possibility of pregnancy. She also denies fever, abdominal pain, nausea, dysuria, hematuria.   Patient Active Problem List   Diagnosis Date Noted   Other pancytopenia (HCC) 03/14/2014   Sjogren's disease (HCC) 09/26/2011   Overweight(278.02) 09/02/2011   Vaginosis 09/02/2011   CIN II (cervical intraepithelial neoplasia II) 09/02/2011   HPV (human papilloma virus) anogenital infection 09/02/2011   HSV-2 (herpes simplex virus 2) infection 09/02/2011   Cleft lip 09/02/2011   Depression    Hypertension    Leukocytopenia     Past Medical History:  Diagnosis Date   Abnormal Pap smear 2006-7   Asthma    Bladder infection     Cervical intraepithelial neoplasia 07/28/2008   Chronic constipation    CIN II (cervical intraepithelial neoplasia II) 05/2008   Depression    H/O varicella    Herpes simplex type 2 infection 2010   Herpesviral infection    HPV (human papilloma virus) infection 07/28/2008   Hypertension    Leukocytopenia    Yeast infection     Past Surgical History:  Procedure Laterality Date   LASER ABLATION OF THE CERVIX  07/28/2008    Social History  Substance Use Topics   Smoking status: Former Smoker    Quit date: 04/01/2003   Smokeless tobacco: Not on file   Alcohol use Yes    Family History  Problem Relation Age of Onset   Hypertension Mother     No Known Allergies  Medication list has been reviewed and updated.  Current Outpatient Prescriptions on File Prior to Visit  Medication Sig Dispense Refill   polyethylene glycol (MIRALAX / GLYCOLAX) packet Take 17 g by mouth daily.     zonisamide (ZONEGRAN) 50 MG capsule Take 50 mg by mouth daily.     naproxen sodium (ANAPROX) 220 MG tablet Take 440 mg by mouth 2 (two) times daily as needed (FOR PAIN).      valACYclovir (VALTREX) 500 MG tablet Take 500 mg by mouth daily.  11   No current facility-administered medications on file prior to visit.     Review of Systems  Constitutional: Negative for fever.  Gastrointestinal: Negative for abdominal pain and nausea.  Genitourinary: Positive for frequency. Negative for dysuria and hematuria.       + Vaginal discharge     Physical Examination: BP (!) 150/100 (BP Location: Right Arm, Patient Position: Sitting, Cuff Size: Normal)    Pulse 88    Temp 98.1 F (36.7 C) (Oral)    Resp 17    Ht 5\' 4"  (1.626 m)    Wt 162 lb (73.5 kg)    LMP 11/16/2015 (Approximate)    SpO2 100%    BMI 27.81 kg/m  Ideal Body Weight: @FLOWAMB ([id number])@([id number])@  Physical Exam  Constitutional: She is oriented to person, place, and time. She appears well-developed and well-nourished. No distress.  HENT:   Head: Normocephalic and atraumatic.  Right Ear: External ear normal.  Left Ear: External ear normal.  Eyes: Conjunctivae and EOM are normal. Pupils are equal, round, and reactive to light.  Cardiovascular: Normal rate and regular rhythm.  Exam reveals no gallop and no friction rub.   No murmur heard. Pulmonary/Chest: Effort normal and breath sounds normal. No respiratory distress. She has no wheezes. She has no rhonchi. She has no rales.  Genitourinary: Cervix exhibits no friability. Vaginal discharge found.  Genitourinary Comments: Normal external genital labia Considerable vaginal discharge that is white and thick along the vaginal canal and cervix No cervical friability Crease at the 7 o'clock position starting at the cervical os and extending to the radius of the cervix, consistent with prior procedures   Neurological: She is alert and oriented to person, place, and time.  Skin: She is not diaphoretic.  Psychiatric: She has a normal mood and affect. Her behavior is normal.    Assessment and Plan: Michelle Wise is a 35 y.o. female who is here today for cc of vaginal discharge. Treating bv.  Obtained gonorrhea/chlamydia.  She declines further std testing as she did this 3 months ago (hiv/rpr) Bacterial vaginosis - Plan: metroNIDAZOLE (FLAGYL) 500 MG tablet, metroNIDAZOLE (METROGEL) 0.75 % vaginal gel, fluconazole (DIFLUCAN) 150 MG tablet  Vaginal discharge - Plan: POCT urinalysis dipstick, POCT Microscopic Urinalysis ([facility]), POCT urine pregnancy, GC/Chlamydia Probe Amp, POCT Wet + KOH Prep, metroNIDAZOLE (FLAGYL) 500 MG tablet, fluconazole (DIFLUCAN) 150 MG tablet, CANCELED: GC/Chlamydia Probe Amp, CANCELED: POCT Skin KOH  Antibiotic-induced yeast infection - Plan: fluconazole (DIFLUCAN) 150 MG tablet  Gonorrhea - Plan: cefTRIAXone (ROCEPHIN) injection 250 mg, azithromycin (ZITHROMAX) 250 MG tablet  Trena PlattStephanie English, PA-C [facility] 11/28/2015 9:13 AM

## 2015-11-29 LAB — GC/CHLAMYDIA PROBE AMP
CT PROBE, AMP APTIMA: NOT DETECTED
GC PROBE AMP APTIMA: DETECTED — AB

## 2015-11-29 MED ORDER — CEFTRIAXONE SODIUM 250 MG IJ SOLR
250.0000 mg | Freq: Once | INTRAMUSCULAR | Status: AC
  Administered 2015-11-30: 250 mg via INTRAMUSCULAR

## 2015-11-29 MED ORDER — AZITHROMYCIN 250 MG PO TABS: 1000.0000 mg | ORAL_TABLET | Freq: Once | ORAL | 0 refills | Status: AC

## 2015-11-30 ENCOUNTER — Encounter (HOSPITAL_COMMUNITY): Payer: Self-pay | Admitting: Obstetrics and Gynecology

## 2015-11-30 ENCOUNTER — Ambulatory Visit (INDEPENDENT_AMBULATORY_CARE_PROVIDER_SITE_OTHER): Payer: Federal, State, Local not specified - PPO

## 2015-11-30 DIAGNOSIS — A549 Gonococcal infection, unspecified: Secondary | ICD-10-CM | POA: Diagnosis not present

## 2015-11-30 NOTE — Patient Instructions (Signed)
Patient received treatment for Gonorrhea Declined STD handout

## 2015-12-24 ENCOUNTER — Ambulatory Visit (INDEPENDENT_AMBULATORY_CARE_PROVIDER_SITE_OTHER): Payer: Federal, State, Local not specified - PPO | Admitting: Physician Assistant

## 2015-12-24 VITALS — BP 124/80 | HR 73 | Temp 98.9°F | Resp 17 | Ht 65.0 in | Wt 164.0 lb

## 2015-12-24 DIAGNOSIS — N898 Other specified noninflammatory disorders of vagina: Secondary | ICD-10-CM

## 2015-12-24 LAB — POCT WET + KOH PREP
TRICH BY WET PREP: ABSENT
Yeast by KOH: ABSENT
Yeast by wet prep: ABSENT

## 2015-12-24 NOTE — Patient Instructions (Addendum)
Try to stay cool and dry in the genital area. Loose-fitting clothing, cotton-crotched underwear. Consider an un-scented panty-liner that can be changed when it's wet. Drink lots of water. Go without underwear at night.    IF you received an x-ray today, you will receive an invoice from [facility]. Please contact [facility] at [phone] with questions or concerns regarding your invoice.   IF you received labwork today, you will receive an invoice from [facility]. Please contact [facility] at [phone] with questions or concerns regarding your invoice.   Our billing staff will not be able to assist you with questions regarding bills from these companies.  You will be contacted with the lab results as soon as they are available. The fastest way to get your results is to activate your My Chart account. Instructions are located on the last page of this paperwork. If you have not heard from us regarding the results in 2 weeks, please contact this office.

## 2015-12-24 NOTE — Progress Notes (Signed)
Subjective:    Patient ID: Michelle Wise, female    DOB: January 06, 1981, 35 y.o.   MRN: [id number] Chief Complaint  Patient presents with  . Follow-up    STD     HPI Patient is here for STD follow-up. Patient was diagnosed with BV, and GC on 11/28/15. She was treated with Metronidazole and Ceftriaxone concomitantly on 11/30/15. She was also given a prescription for Diflucan incase she started showing signs of a yeast infection. Over the next few weeks patient reports the amount of vaginal discharged decreased. However she was worried about developing a yeast infection and reports taking the Diflucan on 12/17/2015.She has becoming increasingly concerned these past few weeks as she has been reading about treatment failure for GC on the Internet. Patient reports minor discharge that is significantly less than before, however she is still concerned the infection has not gone away. Patients reports no fever, chills, frequency, urgency, or burning with urination.   Past Medical History:  Diagnosis Date  . Abnormal Pap smear 2006-7  . Asthma   . Bladder infection   . Cervical intraepithelial neoplasia 07/28/2008  . Chronic constipation   . CIN II (cervical intraepithelial neoplasia II) 05/2008  . Depression   . H/O varicella   . Herpes simplex type 2 infection 2010  . Herpesviral infection   . HPV (human papilloma virus) infection 07/28/2008  . Hypertension   . Leukocytopenia   . Yeast infection    Family History  Problem Relation Age of Onset  . Hypertension Mother    Social History   Social History  . Marital status: Single    Spouse name: N/A  . Number of children: N/A  . Years of education: N/A   Occupational History  . Not on file.   Social History Main Topics  . Smoking status: Former Smoker    Quit date: 04/01/2003  . Smokeless tobacco: Never Used  . Alcohol use Yes  . Drug use: No  . Sexual activity: Yes    Birth control/ protection: Condom   Other Topics Concern  .  Not on file   Social History Narrative  . No narrative on file   Current Outpatient Prescriptions on File Prior to Visit  Medication Sig Dispense Refill  . levothyroxine (SYNTHROID, LEVOTHROID) 25 MCG tablet Take 25 mcg by mouth daily before breakfast.    . naproxen sodium (ANAPROX) 220 MG tablet Take 440 mg by mouth 2 (two) times daily as needed (FOR PAIN).     Marland Kitchen. polyethylene glycol (MIRALAX / GLYCOLAX) packet Take 17 g by mouth daily.    Marland Kitchen. zonisamide (ZONEGRAN) 50 MG capsule Take 50 mg by mouth daily.    . valACYclovir (VALTREX) 500 MG tablet Take 500 mg by mouth daily.  11   No current facility-administered medications on file prior to visit.      Review of Systems All review of systems are negative except for those stated above     Objective:   Physical Exam  Constitutional: She appears well-developed and well-nourished. No distress.  HENT:  Head: Normocephalic and atraumatic.  Right Ear: External ear normal.  Left Ear: External ear normal.  Mouth/Throat: No oropharyngeal exudate.  Eyes: Conjunctivae are normal. Pupils are equal, round, and reactive to light.  Neck: Neck supple.  Cardiovascular: Normal rate, regular rhythm, normal heart sounds and intact distal pulses.  Exam reveals no gallop and no friction rub.   No murmur heard. Pulmonary/Chest: Effort normal and breath sounds normal. No  respiratory distress.  Abdominal: Soft. Bowel sounds are normal.  Lymphadenopathy:    She has no cervical adenopathy.  Skin: Skin is warm and dry. She is not diaphoretic.  BP 124/80 (BP Location: Right Arm, Patient Position: Sitting, Cuff Size: Normal)   Pulse 73   Temp 98.9 F (37.2 C) (Oral)   Resp 17   Ht 5\' 5"  (1.651 m)   Wt 164 lb (74.4 kg)   LMP 11/16/2015 (Approximate)   SpO2 100%   BMI 27.29 kg/m       Assessment & Plan:

## 2015-12-24 NOTE — Progress Notes (Signed)
Patient ID: Carlynn SpryJamila T Spark, female    DOB: 1980-09-02, 35 y.o.   MRN: [id number]  PCP: Emeterio ReeveWOLTERS,SHARON A, MD  Subjective:   Chief Complaint  Patient presents with  . Follow-up    STD     HPI Presents for follow-up following treatment of BV and gonorrhea on 11/28/2015.  She was treated with metronidazole (8/30), ceftriaxone and azithromycin (9/01), and then diflucan following completion of the metronidazole (on 9/18, out of worry, not increased or new/different symptoms).  She has not been sexually active since her visit here. The vaginal discharge she initially presented with has significantly improved, but she is not yet back to her baseline. No fever, chills, GI or GU symptoms. No vaginal itching or burning.    Review of Systems As above.    Patient Active Problem List   Diagnosis Date Noted  . Other pancytopenia (HCC) 03/14/2014  . Sjogren's disease (HCC) 09/26/2011  . Overweight(278.02) 09/02/2011  . Vaginosis 09/02/2011  . CIN II (cervical intraepithelial neoplasia II) 09/02/2011  . HPV (human papilloma virus) anogenital infection 09/02/2011  . HSV-2 (herpes simplex virus 2) infection 09/02/2011  . Cleft lip 09/02/2011  . Depression   . Hypertension   . Leukocytopenia      Prior to Admission medications   Medication Sig Start Date End Date Taking? Authorizing Provider  levothyroxine (SYNTHROID, LEVOTHROID) 25 MCG tablet Take 25 mcg by mouth daily before breakfast.   Yes Historical Provider, MD  naproxen sodium (ANAPROX) 220 MG tablet Take 440 mg by mouth 2 (two) times daily as needed (FOR PAIN).    Yes Historical Provider, MD  polyethylene glycol (MIRALAX / GLYCOLAX) packet Take 17 g by mouth daily.   Yes Historical Provider, MD  zonisamide (ZONEGRAN) 50 MG capsule Take 50 mg by mouth daily.   Yes Historical Provider, MD  valACYclovir (VALTREX) 500 MG tablet Take 500 mg by mouth daily. 04/05/14   Historical Provider, MD     No Known Allergies       Objective:  Physical Exam  Constitutional: She is oriented to person, place, and time. She appears well-developed and well-nourished. She is active and cooperative. No distress.  BP 124/80 (BP Location: Right Arm, Patient Position: Sitting, Cuff Size: Normal)   Pulse 73   Temp 98.9 F (37.2 C) (Oral)   Resp 17   Ht 5\' 5"  (1.651 m)   Wt 164 lb (74.4 kg)   LMP 11/16/2015 (Approximate)   SpO2 100%   BMI 27.29 kg/m    Eyes: Conjunctivae are normal.  Pulmonary/Chest: Effort normal.  Neurological: She is alert and oriented to person, place, and time.  Psychiatric: She has a normal mood and affect. Her speech is normal and behavior is normal.       Results for orders placed or performed in visit on 12/24/15  POCT Wet + KOH Prep  Result Value Ref Range   Yeast by KOH Absent Present, Absent   Yeast by wet prep Absent Present, Absent   WBC by wet prep Many (A) None, Few, Too numerous to count   Clue Cells Wet Prep HPF POC Few (A) None, Too numerous to count   Trich by wet prep Absent Present, Absent   Bacteria Wet Prep HPF POC Many (A) None, Few, Too numerous to count   Epithelial Cells By [employer]et Pref ([facility]) Many (A) None, Few, Too numerous to count   RBC,UR,HPF,POC None None RBC/hpf       Assessment & Plan:   1.  Vaginal discharge Wet prep notable for large WBC and bacteria, but only a few clue cells. She has had significant improvement in her symptoms and I am reluctant to repeat GC testing this soon following treatment.  We elect to continue to monitor her symptoms. She will drink plenty of water, wear cotton-crotched underwear and loose fitting clothing, and sleep without underwear.  If her symptoms do not improve in the next 2-3 weeks, I would repeat wet prep and GC, sooner if symptoms worsen.  If her symptoms resolve, she will return for testing for re-infection, recommended 3 months following treatment. - POCT Wet + KOH Prep   Fernande Bras, PA-C Physician  Assistant-Certified [facility]

## 2016-01-29 DIAGNOSIS — G43839 Menstrual migraine, intractable, without status migrainosus: Secondary | ICD-10-CM | POA: Diagnosis not present

## 2016-01-29 DIAGNOSIS — G43719 Chronic migraine without aura, intractable, without status migrainosus: Secondary | ICD-10-CM | POA: Diagnosis not present

## 2016-01-29 DIAGNOSIS — G43019 Migraine without aura, intractable, without status migrainosus: Secondary | ICD-10-CM | POA: Diagnosis not present

## 2016-01-31 DIAGNOSIS — J329 Chronic sinusitis, unspecified: Secondary | ICD-10-CM | POA: Diagnosis not present

## 2016-01-31 DIAGNOSIS — F419 Anxiety disorder, unspecified: Secondary | ICD-10-CM | POA: Diagnosis not present

## 2016-02-26 ENCOUNTER — Ambulatory Visit (INDEPENDENT_AMBULATORY_CARE_PROVIDER_SITE_OTHER): Payer: Federal, State, Local not specified - PPO | Admitting: Physician Assistant

## 2016-02-26 VITALS — BP 122/72 | HR 78 | Temp 98.9°F | Resp 17 | Ht 65.5 in | Wt 169.0 lb

## 2016-02-26 DIAGNOSIS — Z8619 Personal history of other infectious and parasitic diseases: Secondary | ICD-10-CM

## 2016-02-26 DIAGNOSIS — Z09 Encounter for follow-up examination after completed treatment for conditions other than malignant neoplasm: Secondary | ICD-10-CM | POA: Diagnosis not present

## 2016-02-26 NOTE — Progress Notes (Signed)
[facility] [address], [place] [id number] [phone]  Date:  02/26/2016   Name:  Michelle Wise   DOB:  12-08-1980   MRN:  [id number]  PCP:  Emeterio ReeveWOLTERS,SHARON A, MD    History of Present Illness:  Michelle Wise is a 35 y.o. female patient who [facility] to [facility] for follow-up of vaginal symptoms and diagnosis of gonorrhea. In review of her notes. Patient was seen here 3 months ago for vaginal discharge and symptoms. She was diagnosed with gonorrhea. She was given Rocephin and azithromycin at the time. Patient was feeling nervous and concerned 4 weeks later and was rechecked for vaginal discharge via wet prep. She was advised to return in 3 months for test of cure.  Patient reports no current vaginal symptoms. She denies vaginal discharge or pruritus. She has no pain. She is currently not sexually active. She has not been sexually active since the diagnosis and treatment.   Patient Active Problem List   Diagnosis Date Noted  . Other pancytopenia (HCC) 03/14/2014  . Sjogren's disease (HCC) 09/26/2011  . Overweight(278.02) 09/02/2011  . Vaginosis 09/02/2011  . CIN II (cervical intraepithelial neoplasia II) 09/02/2011  . HPV (human papilloma virus) anogenital infection 09/02/2011  . HSV-2 (herpes simplex virus 2) infection 09/02/2011  . Cleft lip 09/02/2011  . Depression   . Hypertension   . Leukocytopenia     Past Medical History:  Diagnosis Date  . Abnormal Pap smear 2006-7  . Asthma   . Bladder infection   . Cervical intraepithelial neoplasia 07/28/2008  . Chronic constipation   . CIN II (cervical intraepithelial neoplasia II) 05/2008  . Depression   . H/O varicella   . Herpes simplex type 2 infection 2010  . Herpesviral infection   . HPV (human papilloma virus) infection 07/28/2008  . Hypertension   . Leukocytopenia   . Yeast infection     Past Surgical History:  Procedure Laterality Date  . LASER ABLATION OF THE CERVIX  07/28/2008     Social History  Substance Use Topics  . Smoking status: Former Smoker    Quit date: 04/01/2003  . Smokeless tobacco: Never Used  . Alcohol use Yes    Family History  Problem Relation Age of Onset  . Hypertension Mother     No Known Allergies  Medication list has been reviewed and updated.  Current Outpatient Prescriptions on File Prior to Visit  Medication Sig Dispense Refill  . levothyroxine (SYNTHROID, LEVOTHROID) 25 MCG tablet Take 25 mcg by mouth daily before breakfast.    . naproxen sodium (ANAPROX) 220 MG tablet Take 440 mg by mouth 2 (two) times daily as needed (FOR PAIN).     Marland Kitchen. polyethylene glycol (MIRALAX / GLYCOLAX) packet Take 17 g by mouth daily.    . valACYclovir (VALTREX) 500 MG tablet Take 500 mg by mouth daily.  11  . zonisamide (ZONEGRAN) 50 MG capsule Take 50 mg by mouth daily.     No current facility-administered medications on file prior to visit.     ROS ROS otherwise unremarkable unless listed above.  Physical Examination: BP 122/72 (BP Location: Right Arm, Patient Position: Sitting, Cuff Size: Normal)   Pulse 78   Temp 98.9 F (37.2 C) (Oral)   Resp 17   Ht 5' 5.5" (1.664 m)   Wt 169 lb (76.7 kg)   LMP 02/02/2016 (Approximate)   SpO2 98%   BMI 27.70 kg/m  Ideal Body Weight: Weight in (lb)  to have BMI = 25: 152.2  Physical Exam  Constitutional: She is oriented to person, place, and time. She appears well-developed and well-nourished. No distress.  HENT:  Head: Normocephalic and atraumatic.  Right Ear: External ear normal.  Left Ear: External ear normal.  Eyes: Conjunctivae and EOM are normal. Pupils are equal, round, and reactive to light.  Cardiovascular: Normal rate.   Pulmonary/Chest: Effort normal. No respiratory distress.  Neurological: She is alert and oriented to person, place, and time.  Skin: She is not diaphoretic.  Psychiatric: She has a normal mood and affect. Her behavior is normal.     Assessment and Plan: Michelle SpryJamila T  Wise is a 35 y.o. female who is here today for follow-up. Symptoms to appear to have virtually resolved. She declines a vaginal exam. She has urinated today within the last 2 hours. I've advised her to return with the urine cup within the week. He was advised to do it upon waking. She acknowledged and voiced understanding. Hx of gonorrhea - Plan: GC/Chlamydia Probe Amp, CANCELED: GC/Chlamydia Probe Amp  Follow up - Plan: GC/Chlamydia Probe Amp, CANCELED: GC/Chlamydia Probe Amp  Trena PlattStephanie English, PA-C [facility] 11/28/201711:17 AM

## 2016-02-26 NOTE — Patient Instructions (Signed)
     IF you received an x-ray today, you will receive an invoice from [facility]. Please contact [facility] at [phone] with questions or concerns regarding your invoice.   IF you received labwork today, you will receive an invoice from [facility]. Please contact [facility] at [phone] with questions or concerns regarding your invoice.   Our billing staff will not be able to assist you with questions regarding bills from these companies.  You will be contacted with the lab results as soon as they are available. The fastest way to get your results is to activate your My Chart account. Instructions are located on the last page of this paperwork. If you have not heard from us regarding the results in 2 weeks, please contact this office.      

## 2016-03-18 DIAGNOSIS — R05 Cough: Secondary | ICD-10-CM | POA: Diagnosis not present

## 2016-03-25 DIAGNOSIS — G43019 Migraine without aura, intractable, without status migrainosus: Secondary | ICD-10-CM | POA: Diagnosis not present

## 2016-03-25 DIAGNOSIS — G43839 Menstrual migraine, intractable, without status migrainosus: Secondary | ICD-10-CM | POA: Diagnosis not present

## 2016-03-25 DIAGNOSIS — G43719 Chronic migraine without aura, intractable, without status migrainosus: Secondary | ICD-10-CM | POA: Diagnosis not present

## 2016-03-26 DIAGNOSIS — M3500 Sicca syndrome, unspecified: Secondary | ICD-10-CM | POA: Diagnosis not present

## 2016-04-02 DIAGNOSIS — B372 Candidiasis of skin and nail: Secondary | ICD-10-CM | POA: Diagnosis not present

## 2016-04-02 DIAGNOSIS — N898 Other specified noninflammatory disorders of vagina: Secondary | ICD-10-CM | POA: Diagnosis not present

## 2017-01-15 DIAGNOSIS — N899 Noninflammatory disorder of vagina, unspecified: Secondary | ICD-10-CM | POA: Diagnosis not present

## 2017-01-27 DIAGNOSIS — Z6831 Body mass index (BMI) 31.0-31.9, adult: Secondary | ICD-10-CM | POA: Diagnosis not present

## 2017-01-27 DIAGNOSIS — Z01411 Encounter for gynecological examination (general) (routine) with abnormal findings: Secondary | ICD-10-CM | POA: Diagnosis not present

## 2017-01-27 DIAGNOSIS — Z124 Encounter for screening for malignant neoplasm of cervix: Secondary | ICD-10-CM | POA: Diagnosis not present

## 2017-01-27 DIAGNOSIS — R87618 Other abnormal cytological findings on specimens from cervix uteri: Secondary | ICD-10-CM | POA: Diagnosis not present

## 2017-02-24 DIAGNOSIS — R635 Abnormal weight gain: Secondary | ICD-10-CM | POA: Diagnosis not present

## 2017-02-24 DIAGNOSIS — I10 Essential (primary) hypertension: Secondary | ICD-10-CM | POA: Diagnosis not present

## 2017-02-24 DIAGNOSIS — Z79899 Other long term (current) drug therapy: Secondary | ICD-10-CM | POA: Diagnosis not present

## 2017-02-24 DIAGNOSIS — E039 Hypothyroidism, unspecified: Secondary | ICD-10-CM | POA: Diagnosis not present

## 2017-02-24 DIAGNOSIS — R5383 Other fatigue: Secondary | ICD-10-CM | POA: Diagnosis not present

## 2017-02-24 DIAGNOSIS — E063 Autoimmune thyroiditis: Secondary | ICD-10-CM | POA: Diagnosis not present

## 2017-04-16 DIAGNOSIS — E039 Hypothyroidism, unspecified: Secondary | ICD-10-CM | POA: Diagnosis not present

## 2017-06-02 DIAGNOSIS — N898 Other specified noninflammatory disorders of vagina: Secondary | ICD-10-CM | POA: Diagnosis not present

## 2017-06-04 DIAGNOSIS — M3500 Sicca syndrome, unspecified: Secondary | ICD-10-CM | POA: Diagnosis not present

## 2017-06-04 DIAGNOSIS — E039 Hypothyroidism, unspecified: Secondary | ICD-10-CM | POA: Diagnosis not present

## 2017-07-17 DIAGNOSIS — H04123 Dry eye syndrome of bilateral lacrimal glands: Secondary | ICD-10-CM | POA: Diagnosis not present

## 2017-07-17 DIAGNOSIS — H40033 Anatomical narrow angle, bilateral: Secondary | ICD-10-CM | POA: Diagnosis not present

## 2017-08-04 DIAGNOSIS — E039 Hypothyroidism, unspecified: Secondary | ICD-10-CM | POA: Diagnosis not present

## 2017-09-16 DIAGNOSIS — J0100 Acute maxillary sinusitis, unspecified: Secondary | ICD-10-CM | POA: Diagnosis not present

## 2017-09-28 DIAGNOSIS — E039 Hypothyroidism, unspecified: Secondary | ICD-10-CM | POA: Diagnosis not present

## 2017-11-01 ENCOUNTER — Other Ambulatory Visit: Payer: Self-pay

## 2017-11-01 ENCOUNTER — Emergency Department (HOSPITAL_COMMUNITY)
Admission: EM | Admit: 2017-11-01 | Discharge: 2017-11-01 | Disposition: A | Discharge status: Home / Self Care | Payer: Federal, State, Local not specified - PPO | Attending: Emergency Medicine | Admitting: Emergency Medicine

## 2017-11-01 ENCOUNTER — Emergency Department (HOSPITAL_COMMUNITY): Payer: Federal, State, Local not specified - PPO

## 2017-11-01 ENCOUNTER — Encounter (HOSPITAL_COMMUNITY): Payer: Self-pay

## 2017-11-01 DIAGNOSIS — K2900 Acute gastritis without bleeding: Secondary | ICD-10-CM | POA: Diagnosis not present

## 2017-11-01 DIAGNOSIS — Z87891 Personal history of nicotine dependence: Secondary | ICD-10-CM | POA: Diagnosis not present

## 2017-11-01 DIAGNOSIS — R1013 Epigastric pain: Secondary | ICD-10-CM | POA: Diagnosis not present

## 2017-11-01 DIAGNOSIS — Z79899 Other long term (current) drug therapy: Secondary | ICD-10-CM | POA: Insufficient documentation

## 2017-11-01 DIAGNOSIS — I10 Essential (primary) hypertension: Secondary | ICD-10-CM | POA: Insufficient documentation

## 2017-11-01 DIAGNOSIS — J45909 Unspecified asthma, uncomplicated: Secondary | ICD-10-CM | POA: Insufficient documentation

## 2017-11-01 DIAGNOSIS — K2960 Other gastritis without bleeding: Secondary | ICD-10-CM | POA: Diagnosis not present

## 2017-11-01 DIAGNOSIS — R110 Nausea: Secondary | ICD-10-CM | POA: Diagnosis not present

## 2017-11-01 LAB — CBC WITH DIFFERENTIAL/PLATELET
BASOS PCT: 0 %
Basophils Absolute: 0 10*3/uL (ref 0.0–0.1)
EOS ABS: 0 10*3/uL (ref 0.0–0.7)
EOS PCT: 2 %
HCT: 40 % (ref 36.0–46.0)
Hemoglobin: 13.1 g/dL (ref 12.0–15.0)
Lymphocytes Relative: 51 %
Lymphs Abs: 1.2 10*3/uL (ref 0.7–4.0)
MCH: 28.9 pg (ref 26.0–34.0)
MCHC: 32.8 g/dL (ref 30.0–36.0)
MCV: 88.1 fL (ref 78.0–100.0)
Monocytes Absolute: 0.3 10*3/uL (ref 0.1–1.0)
Monocytes Relative: 11 %
Neutro Abs: 0.8 10*3/uL — ABNORMAL LOW (ref 1.7–7.7)
Neutrophils Relative %: 36 %
Platelets: 282 10*3/uL (ref 150–400)
RBC: 4.54 MIL/uL (ref 3.87–5.11)
RDW: 14.2 % (ref 11.5–15.5)
WBC: 2.3 10*3/uL — ABNORMAL LOW (ref 4.0–10.5)

## 2017-11-01 LAB — COMPREHENSIVE METABOLIC PANEL
ALT: 32 U/L (ref 0–44)
ANION GAP: 8 (ref 5–15)
AST: 31 U/L (ref 15–41)
Albumin: 4.1 g/dL (ref 3.5–5.0)
Alkaline Phosphatase: 53 U/L (ref 38–126)
BUN: 8 mg/dL (ref 6–20)
CHLORIDE: 105 mmol/L (ref 98–111)
CO2: 26 mmol/L (ref 22–32)
CREATININE: 0.74 mg/dL (ref 0.44–1.00)
Calcium: 9.5 mg/dL (ref 8.9–10.3)
Glucose, Bld: 90 mg/dL (ref 70–99)
POTASSIUM: 4.3 mmol/L (ref 3.5–5.1)
Sodium: 139 mmol/L (ref 135–145)
TOTAL PROTEIN: 8.9 g/dL — AB (ref 6.5–8.1)
Total Bilirubin: 0.7 mg/dL (ref 0.3–1.2)

## 2017-11-01 LAB — I-STAT BETA HCG BLOOD, ED (MC, WL, AP ONLY): I-stat hCG, quantitative: <5 m[IU]/mL (ref <5)

## 2017-11-01 LAB — LIPASE, BLOOD: LIPASE: 26 U/L (ref 11–51)

## 2017-11-01 MED ORDER — FAMOTIDINE IN NACL 20-0.9 MG/50ML-% IV SOLN
20.0000 mg | Freq: Once | INTRAVENOUS | Status: AC
  Administered 2017-11-01: 20 mg via INTRAVENOUS
  Filled 2017-11-01: qty 50

## 2017-11-01 MED ORDER — PANTOPRAZOLE SODIUM 20 MG PO TBEC: 20.0000 mg | DELAYED_RELEASE_TABLET | Freq: Every day | ORAL | 0 refills | Status: AC

## 2017-11-01 MED ORDER — ONDANSETRON HCL 4 MG/2ML IJ SOLN
4.0000 mg | Freq: Once | INTRAMUSCULAR | Status: AC
  Administered 2017-11-01: 4 mg via INTRAVENOUS
  Filled 2017-11-01: qty 2

## 2017-11-01 MED ORDER — ONDANSETRON 4 MG PO TBDP: ORAL_TABLET | ORAL | 0 refills | Status: AC

## 2017-11-01 NOTE — ED Provider Notes (Signed)
Procedures EMERGENCY DEPARTMENT  US GUIDANCE EXAM Emergency Ultrasound:  US Guidance for Needle Guidance  INDICATIONS: Difficult vascular access Linear probe used in real-time to visualize location of needle entry through skin.   PERFORMED BY: Myself IMAGES ARCHIVED?: No LIMITATIONS: None VIEWS USED: Transverse INTERPRETATION: Needle visualized within vein and Right arm  20 gauge right antecubital Difficult iv per rn request us placement   Margarita Grizzleay, Frady Taddeo, MD 11/01/17 1236

## 2017-11-01 NOTE — Discharge Instructions (Addendum)
Follow-up with your regular doctor next week for recheck 

## 2017-11-01 NOTE — ED Triage Notes (Signed)
She c/o recent fatigue (she states she has some level of chronic fatigue); and c/o epigastric discomfort which began yesterday. She is in no distress.

## 2017-11-01 NOTE — ED Notes (Signed)
Pt. Voided and forgot to get a urine specimen. Nurse aware.

## 2017-11-01 NOTE — ED Provider Notes (Signed)
[facility]   CSN: [id number] Arrival date & time: 11/01/17  1025     History   Chief Complaint Chief Complaint  Patient presents with  . Nausea    HPI Michelle Wise is a 37 y.o. female.  Patient complains of nausea with some epigastric discomfort  The history is provided by the patient. No language interpreter was used.  Illness  This is a new problem. The current episode started 1 to 2 hours ago. The problem occurs constantly. The problem has not changed since onset.Associated symptoms include abdominal pain. Pertinent negatives include no chest pain and no headaches. Nothing aggravates the symptoms. Nothing relieves the symptoms. She has tried nothing for the symptoms. The treatment provided no relief.    Past Medical History:  Diagnosis Date  . Abnormal Pap smear 2006-7  . Asthma   . Bladder infection   . Cervical intraepithelial neoplasia 07/28/2008  . Chronic constipation   . CIN II (cervical intraepithelial neoplasia II) 05/2008  . Depression   . H/O varicella   . Herpes simplex type 2 infection 2010  . Herpesviral infection   . HPV (human papilloma virus) infection 07/28/2008  . Hypertension   . Leukocytopenia   . Yeast infection     Patient Active Problem List   Diagnosis Date Noted  . Other pancytopenia (HCC) 03/14/2014  . Sjogren's disease (HCC) 09/26/2011  . Overweight(278.02) 09/02/2011  . Vaginosis 09/02/2011  . CIN II (cervical intraepithelial neoplasia II) 09/02/2011  . HPV (human papilloma virus) anogenital infection 09/02/2011  . HSV-2 (herpes simplex virus 2) infection 09/02/2011  . Cleft lip 09/02/2011  . Depression   . Hypertension   . Leukocytopenia     Past Surgical History:  Procedure Laterality Date  . LASER ABLATION OF THE CERVIX  07/28/2008     OB History    Gravida  1   Para  0   Term      Preterm      AB  1   Living        SAB      TAB  1   Ectopic      Multiple      Live Births               Home Medications    Prior to Admission medications   Medication Sig Start Date End Date Taking? Authorizing Provider  amLODipine (NORVASC) 5 MG tablet Take 5 mg by mouth daily. 10/12/17  Yes [provider]  Ascorbic Acid (VITAMIN C PO) Take 1 tablet by mouth daily.   Yes [provider]  Cetirizine HCl (ZYRTEC PO) Take 1 tablet by mouth daily.   Yes [provider]  Cholecalciferol (VITAMIN D PO) Take 1 tablet by mouth daily.   Yes [provider]  Cyanocobalamin (B-12 PO) Take 1 tablet by mouth daily.   Yes [provider]  levothyroxine (SYNTHROID, LEVOTHROID) 100 MCG tablet Take 100 mcg by mouth every morning. 10/15/17  Yes [provider]  senna (SENOKOT) 8.6 MG tablet Take 2 tablets by mouth daily as needed for constipation.   Yes [provider]  ondansetron (ZOFRAN ODT) 4 MG disintegrating tablet 4mg  ODT q4 hours prn nausea/vomit 11/01/17   Bethann Berkshire, MD  pantoprazole (PROTONIX) 20 MG tablet Take 1 tablet (20 mg total) by mouth daily. 11/01/17   Bethann Berkshire, MD    Family History Family History  Problem Relation Age of Onset  .  Hypertension Mother     Social History Social History   Tobacco Use  . Smoking status: Former Smoker    Last attempt to quit: 04/01/2003    Years since quitting: 14.5  . Smokeless tobacco: Never Used  Substance Use Topics  . Alcohol use: Yes  . Drug use: No     Allergies   Patient has no known allergies.   Review of Systems Review of Systems  Constitutional: Negative for appetite change and fatigue.  HENT: Negative for congestion, ear discharge and sinus pressure.   Eyes: Negative for discharge.  Respiratory: Negative for cough.   Cardiovascular: Negative for chest pain.  Gastrointestinal: Positive for abdominal pain. Negative for diarrhea.  Genitourinary: Negative for frequency and hematuria.  Musculoskeletal: Negative for  back pain.  Skin: Negative for rash.  Neurological: Negative for seizures and headaches.  Psychiatric/Behavioral: Negative for hallucinations.     Physical Exam Updated Vital Signs BP 130/87 (BP Location: Left Arm)   Pulse 71   Temp 98.9 F (37.2 C) (Oral)   Resp 17   LMP 10/14/2017 (Approximate)   SpO2 100%   Physical Exam  Constitutional: She is oriented to person, place, and time. She appears well-developed.  HENT:  Head: Normocephalic.  Eyes: Conjunctivae and EOM are normal. No scleral icterus.  Neck: Neck supple. No thyromegaly present.  Cardiovascular: Normal rate and regular rhythm. Exam reveals no gallop and no friction rub.  No murmur heard. Pulmonary/Chest: No stridor. She has no wheezes. She has no rales. She exhibits no tenderness.  Abdominal: She exhibits no distension. There is tenderness. There is no rebound.  Tender epigastric area  Musculoskeletal: Normal range of motion. She exhibits no edema.  Lymphadenopathy:    She has no cervical adenopathy.  Neurological: She is oriented to person, place, and time. She exhibits normal muscle tone. Coordination normal.  Skin: No rash noted. No erythema.  Psychiatric: She has a normal mood and affect. Her behavior is normal.     ED Treatments / Results  Labs (all labs ordered are listed, but only abnormal results are displayed) Labs Reviewed  CBC WITH DIFFERENTIAL/PLATELET - Abnormal; Notable for the following components:      Result Value   WBC 2.3 (*)    Neutro Abs 0.8 (*)    All other components within normal limits  COMPREHENSIVE METABOLIC PANEL - Abnormal; Notable for the following components:   Total Protein 8.9 (*)    All other components within normal limits  LIPASE, BLOOD  I-STAT BETA HCG BLOOD, ED ([facility], [facility], [facility] ONLY)    EKG None  Radiology Dg Abd Acute W/chest  Result Date: 11/01/2017 CLINICAL DATA:  Epigastric pain beginning yesterday. EXAM: DG ABDOMEN ACUTE W/ 1V CHEST COMPARISON:  None.  FINDINGS: There is no evidence of dilated bowel loops or free intraperitoneal air. No radiopaque calculi or other significant radiographic abnormality is seen. Heart size and mediastinal contours are within normal limits. Both lungs are clear. IMPRESSION: Negative abdominal radiographs.  No active cardiopulmonary disease. Electronically Signed   By: Myles Rosenthal M.D.   On: 11/01/2017 12:34    Procedures Procedures (including critical care time)  Medications Ordered in ED Medications  famotidine (PEPCID) IVPB 20 mg premix (0 mg Intravenous Stopped 11/01/17 1304)  ondansetron (ZOFRAN) injection 4 mg (4 mg Intravenous Given 11/01/17 1228)     Initial Impression / Assessment and Plan / ED Course  I have reviewed the triage vital signs and the nursing notes.  Pertinent labs &  imaging results that were available during my care of the patient were reviewed by me and considered in my medical decision making (see chart for details).     Patient with abdominal pain most likely gastritis.  She will be placed on Protonix and Zofran  Final Clinical Impressions(s) / ED Diagnoses   Final diagnoses:  Other acute gastritis without hemorrhage    ED Discharge Orders        Ordered    pantoprazole (PROTONIX) 20 MG tablet  Daily     11/01/17 1544    ondansetron (ZOFRAN ODT) 4 MG disintegrating tablet     11/01/17 1544       Bethann BerkshireZammit, Simon Llamas, MD 11/03/17 2046

## 2017-11-12 DIAGNOSIS — E049 Nontoxic goiter, unspecified: Secondary | ICD-10-CM | POA: Diagnosis not present

## 2017-11-12 DIAGNOSIS — R110 Nausea: Secondary | ICD-10-CM | POA: Diagnosis not present

## 2017-11-12 DIAGNOSIS — I10 Essential (primary) hypertension: Secondary | ICD-10-CM | POA: Diagnosis not present

## 2017-11-12 DIAGNOSIS — D61818 Other pancytopenia: Secondary | ICD-10-CM | POA: Diagnosis not present

## 2017-11-12 DIAGNOSIS — R5383 Other fatigue: Secondary | ICD-10-CM | POA: Diagnosis not present

## 2017-11-12 DIAGNOSIS — Z79899 Other long term (current) drug therapy: Secondary | ICD-10-CM | POA: Diagnosis not present

## 2017-11-12 DIAGNOSIS — Z0000 Encounter for general adult medical examination without abnormal findings: Secondary | ICD-10-CM | POA: Diagnosis not present

## 2017-11-24 DIAGNOSIS — R300 Dysuria: Secondary | ICD-10-CM | POA: Diagnosis not present

## 2017-11-24 DIAGNOSIS — N390 Urinary tract infection, site not specified: Secondary | ICD-10-CM | POA: Diagnosis not present

## 2017-12-17 DIAGNOSIS — K5900 Constipation, unspecified: Secondary | ICD-10-CM | POA: Diagnosis not present

## 2017-12-21 ENCOUNTER — Other Ambulatory Visit: Payer: Self-pay | Admitting: Gastroenterology

## 2017-12-21 ENCOUNTER — Ambulatory Visit
Admission: RE | Admit: 2017-12-21 | Discharge: 2017-12-21 | Disposition: A | Discharge status: Home / Self Care | Payer: Federal, State, Local not specified - PPO | Source: Ambulatory Visit | Attending: Gastroenterology | Admitting: Gastroenterology

## 2017-12-21 DIAGNOSIS — K5900 Constipation, unspecified: Secondary | ICD-10-CM

## 2017-12-21 DIAGNOSIS — K56609 Unspecified intestinal obstruction, unspecified as to partial versus complete obstruction: Secondary | ICD-10-CM | POA: Diagnosis not present

## 2017-12-23 ENCOUNTER — Ambulatory Visit
Admission: RE | Admit: 2017-12-23 | Discharge: 2017-12-23 | Disposition: A | Discharge status: Home / Self Care | Payer: Federal, State, Local not specified - PPO | Source: Ambulatory Visit | Attending: Gastroenterology | Admitting: Gastroenterology

## 2017-12-23 ENCOUNTER — Other Ambulatory Visit: Payer: Self-pay | Admitting: Gastroenterology

## 2017-12-23 DIAGNOSIS — K5900 Constipation, unspecified: Secondary | ICD-10-CM | POA: Diagnosis not present

## 2018-01-11 DIAGNOSIS — J019 Acute sinusitis, unspecified: Secondary | ICD-10-CM | POA: Diagnosis not present

## 2018-01-11 DIAGNOSIS — J069 Acute upper respiratory infection, unspecified: Secondary | ICD-10-CM | POA: Diagnosis not present

## 2018-01-21 DIAGNOSIS — I10 Essential (primary) hypertension: Secondary | ICD-10-CM | POA: Diagnosis not present

## 2018-02-02 DIAGNOSIS — Z6831 Body mass index (BMI) 31.0-31.9, adult: Secondary | ICD-10-CM | POA: Diagnosis not present

## 2018-02-02 DIAGNOSIS — Z01411 Encounter for gynecological examination (general) (routine) with abnormal findings: Secondary | ICD-10-CM | POA: Diagnosis not present

## 2018-02-02 DIAGNOSIS — N898 Other specified noninflammatory disorders of vagina: Secondary | ICD-10-CM | POA: Diagnosis not present

## 2018-02-02 DIAGNOSIS — Z124 Encounter for screening for malignant neoplasm of cervix: Secondary | ICD-10-CM | POA: Diagnosis not present

## 2018-02-02 DIAGNOSIS — Z113 Encounter for screening for infections with a predominantly sexual mode of transmission: Secondary | ICD-10-CM | POA: Diagnosis not present

## 2018-02-17 DIAGNOSIS — I10 Essential (primary) hypertension: Secondary | ICD-10-CM | POA: Diagnosis not present

## 2018-03-29 DIAGNOSIS — Z634 Disappearance and death of family member: Secondary | ICD-10-CM | POA: Diagnosis not present

## 2018-03-29 DIAGNOSIS — N898 Other specified noninflammatory disorders of vagina: Secondary | ICD-10-CM | POA: Diagnosis not present

## 2018-03-29 DIAGNOSIS — R5383 Other fatigue: Secondary | ICD-10-CM | POA: Diagnosis not present

## 2018-05-14 DIAGNOSIS — J101 Influenza due to other identified influenza virus with other respiratory manifestations: Secondary | ICD-10-CM | POA: Diagnosis not present

## 2018-05-14 DIAGNOSIS — R6889 Other general symptoms and signs: Secondary | ICD-10-CM | POA: Diagnosis not present

## 2018-05-22 DIAGNOSIS — B349 Viral infection, unspecified: Secondary | ICD-10-CM | POA: Diagnosis not present

## 2018-05-28 DIAGNOSIS — J209 Acute bronchitis, unspecified: Secondary | ICD-10-CM | POA: Diagnosis not present

## 2018-06-08 DIAGNOSIS — M3503 Sicca syndrome with myopathy: Secondary | ICD-10-CM | POA: Diagnosis not present

## 2018-06-08 DIAGNOSIS — I10 Essential (primary) hypertension: Secondary | ICD-10-CM | POA: Diagnosis not present

## 2018-10-20 DIAGNOSIS — M79602 Pain in left arm: Secondary | ICD-10-CM | POA: Diagnosis not present

## 2018-10-20 DIAGNOSIS — R51 Headache: Secondary | ICD-10-CM | POA: Diagnosis not present

## 2018-10-21 DIAGNOSIS — Z20828 Contact with and (suspected) exposure to other viral communicable diseases: Secondary | ICD-10-CM | POA: Diagnosis not present

## 2018-11-08 DIAGNOSIS — R05 Cough: Secondary | ICD-10-CM | POA: Diagnosis not present

## 2018-11-08 DIAGNOSIS — M3500 Sicca syndrome, unspecified: Secondary | ICD-10-CM | POA: Diagnosis not present

## 2018-11-08 DIAGNOSIS — J069 Acute upper respiratory infection, unspecified: Secondary | ICD-10-CM | POA: Diagnosis not present

## 2018-11-12 DIAGNOSIS — J069 Acute upper respiratory infection, unspecified: Secondary | ICD-10-CM | POA: Diagnosis not present

## 2018-11-12 DIAGNOSIS — M3500 Sicca syndrome, unspecified: Secondary | ICD-10-CM | POA: Diagnosis not present

## 2019-01-11 DIAGNOSIS — Z0000 Encounter for general adult medical examination without abnormal findings: Secondary | ICD-10-CM | POA: Diagnosis not present

## 2019-01-11 DIAGNOSIS — Z79899 Other long term (current) drug therapy: Secondary | ICD-10-CM | POA: Diagnosis not present

## 2019-01-11 DIAGNOSIS — I10 Essential (primary) hypertension: Secondary | ICD-10-CM | POA: Diagnosis not present

## 2019-01-11 DIAGNOSIS — Z23 Encounter for immunization: Secondary | ICD-10-CM | POA: Diagnosis not present

## 2019-01-11 DIAGNOSIS — E039 Hypothyroidism, unspecified: Secondary | ICD-10-CM | POA: Diagnosis not present

## 2019-01-15 DIAGNOSIS — H40033 Anatomical narrow angle, bilateral: Secondary | ICD-10-CM | POA: Diagnosis not present

## 2019-01-15 DIAGNOSIS — H04123 Dry eye syndrome of bilateral lacrimal glands: Secondary | ICD-10-CM | POA: Diagnosis not present

## 2019-02-02 DIAGNOSIS — R768 Other specified abnormal immunological findings in serum: Secondary | ICD-10-CM | POA: Diagnosis not present

## 2019-02-02 DIAGNOSIS — R0683 Snoring: Secondary | ICD-10-CM | POA: Diagnosis not present

## 2019-02-02 DIAGNOSIS — H43393 Other vitreous opacities, bilateral: Secondary | ICD-10-CM | POA: Diagnosis not present

## 2019-02-02 DIAGNOSIS — R5382 Chronic fatigue, unspecified: Secondary | ICD-10-CM | POA: Diagnosis not present

## 2019-02-07 DIAGNOSIS — I10 Essential (primary) hypertension: Secondary | ICD-10-CM | POA: Diagnosis not present

## 2019-02-07 DIAGNOSIS — J019 Acute sinusitis, unspecified: Secondary | ICD-10-CM | POA: Diagnosis not present

## 2019-02-08 ENCOUNTER — Other Ambulatory Visit: Payer: Self-pay

## 2019-02-08 DIAGNOSIS — Z20822 Contact with and (suspected) exposure to covid-19: Secondary | ICD-10-CM

## 2019-02-10 LAB — NOVEL CORONAVIRUS, NAA: SARS-CoV-2, NAA: NOT DETECTED

## 2019-02-18 IMAGING — CR DG ABDOMEN ACUTE W/ 1V CHEST
3 series · 3 of 3 positions shown · non-contrast
Comparison: None.

CLINICAL DATA: Epigastric pain beginning yesterday.

EXAM:
DG ABDOMEN ACUTE W/ 1V CHEST

[w chest pa]
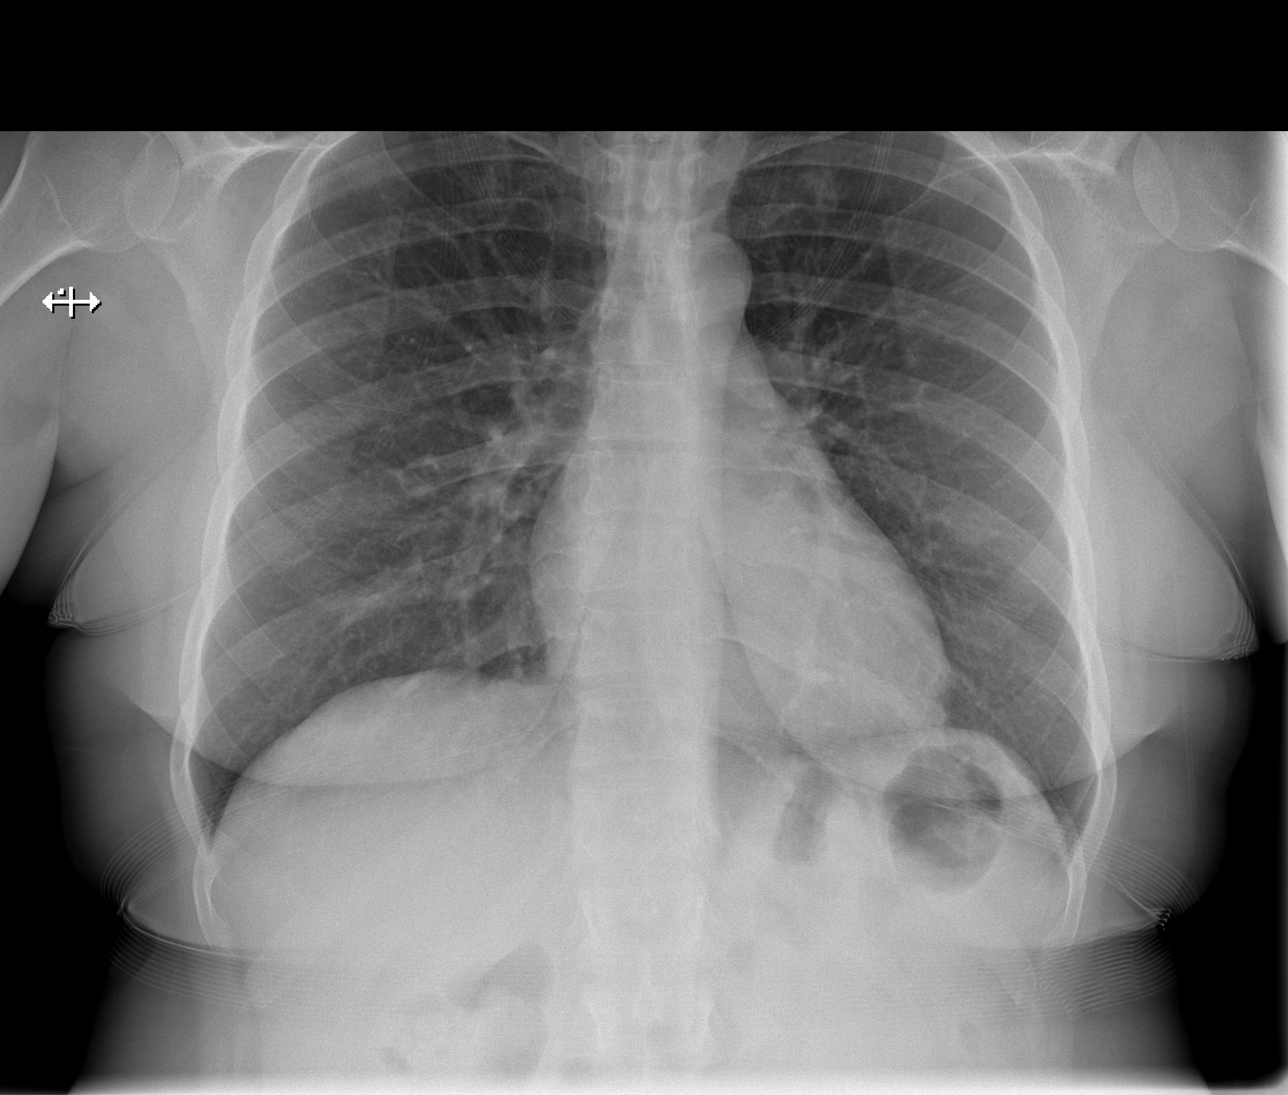

[w abdomen upright]
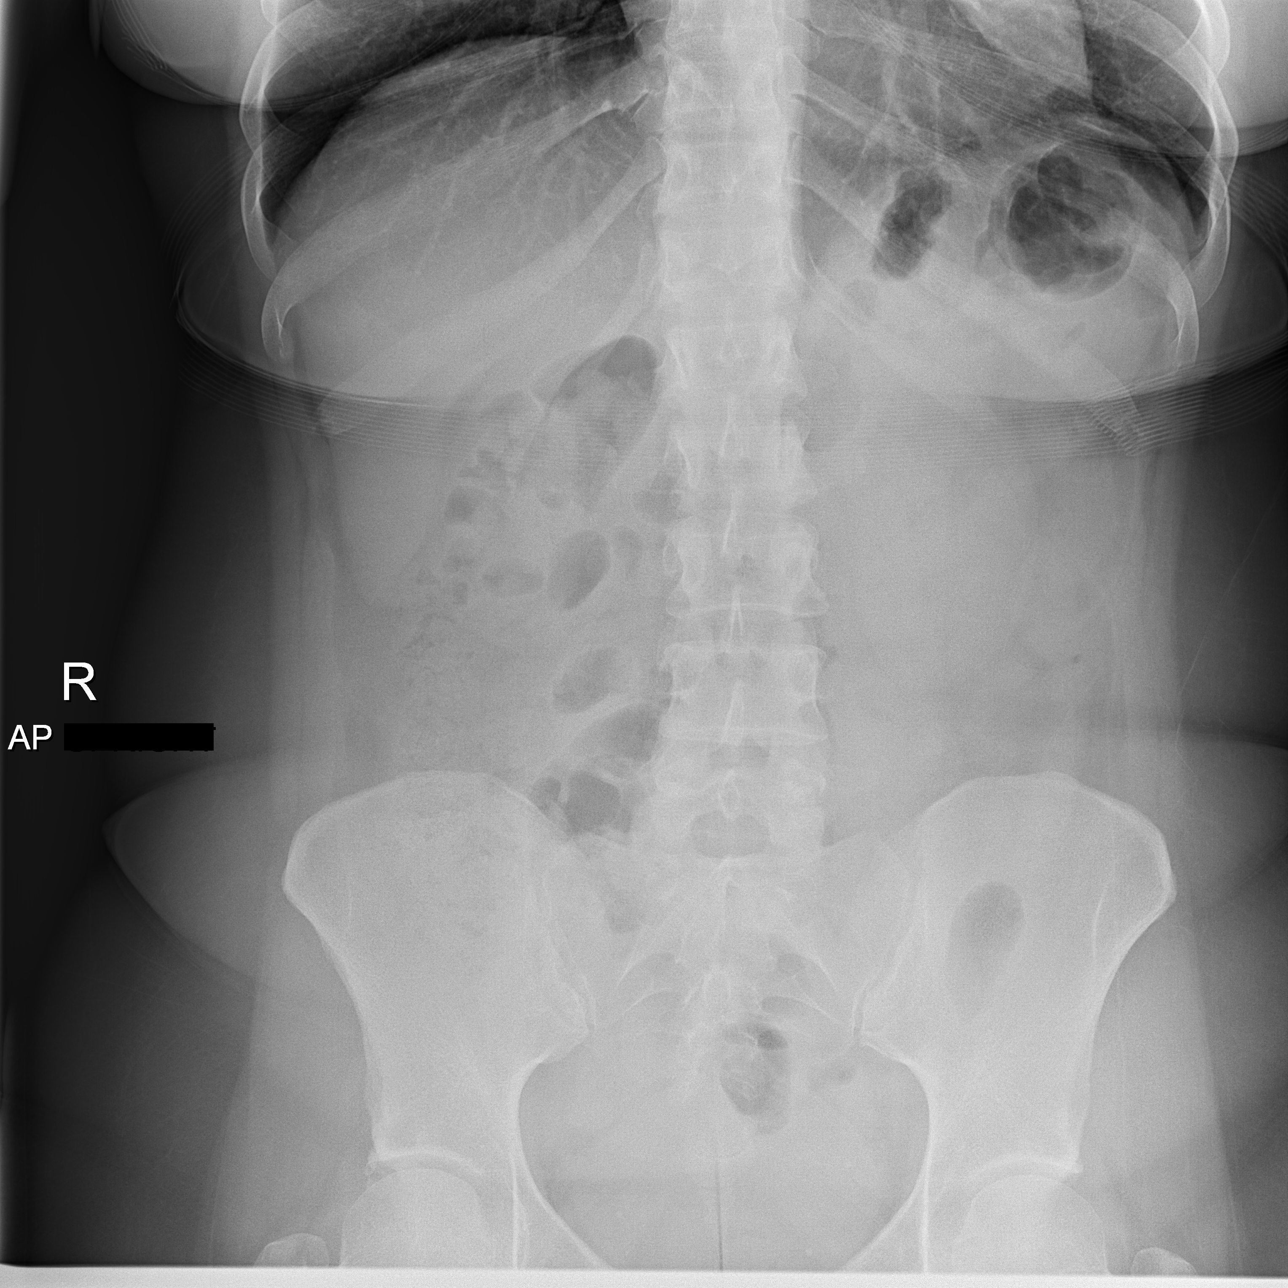

[t abdomen supine]
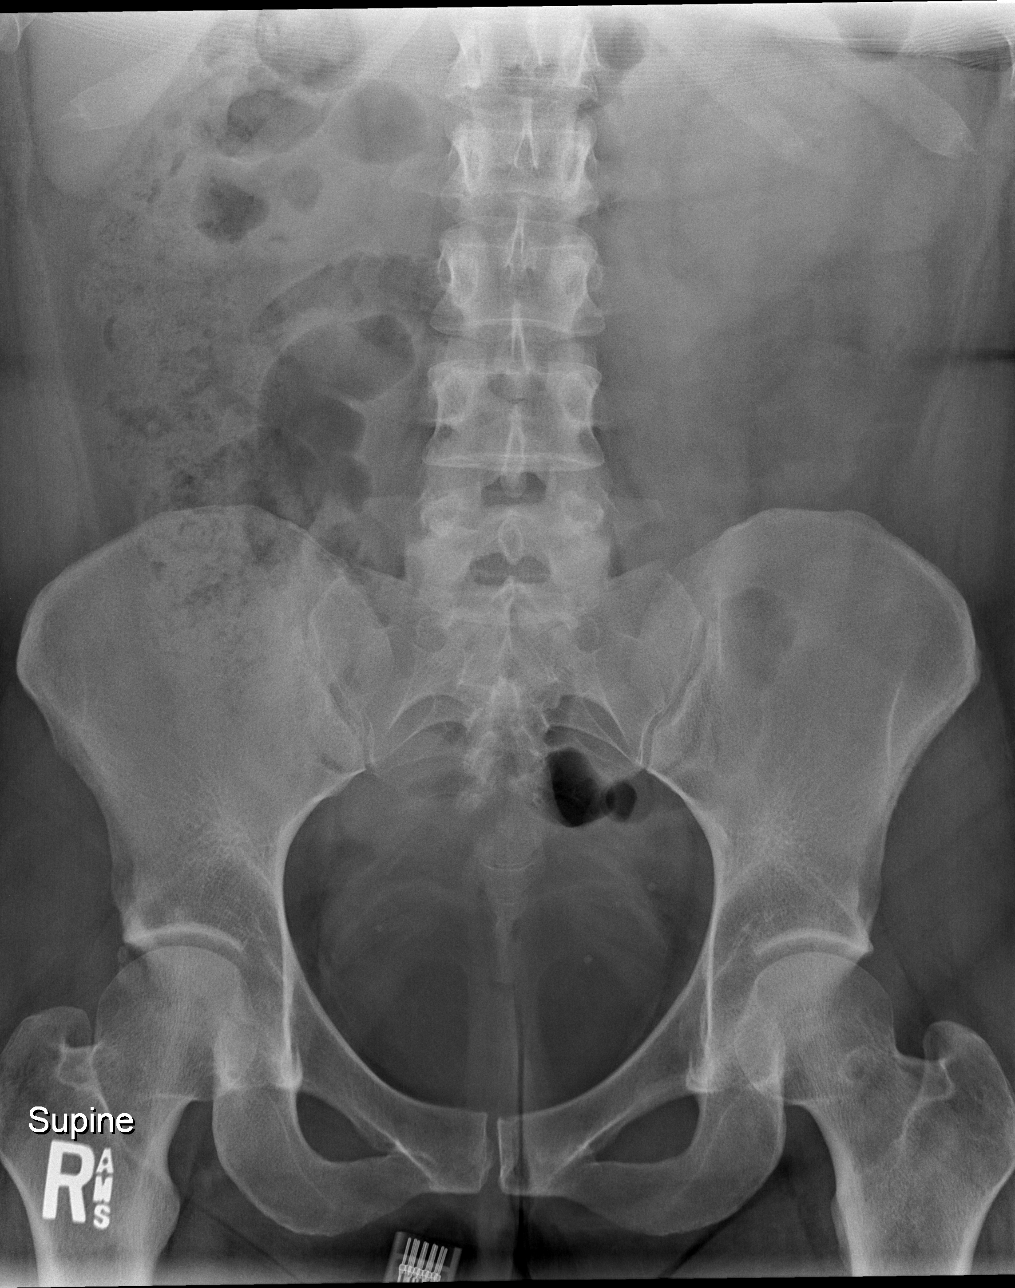

[3 of 3 positions shown; findings below may reference images not displayed]

FINDINGS: There is no evidence of dilated bowel loops or free intraperitoneal
air. No radiopaque calculi or other significant radiographic
abnormality is seen.

Heart size and mediastinal contours are within normal limits. Both
lungs are clear.
IMPRESSION: Negative abdominal radiographs.  No active cardiopulmonary disease.

## 2019-03-12 DIAGNOSIS — Z20828 Contact with and (suspected) exposure to other viral communicable diseases: Secondary | ICD-10-CM | POA: Diagnosis not present

## 2019-04-07 DIAGNOSIS — I10 Essential (primary) hypertension: Secondary | ICD-10-CM | POA: Diagnosis not present

## 2019-04-12 DIAGNOSIS — Z20828 Contact with and (suspected) exposure to other viral communicable diseases: Secondary | ICD-10-CM | POA: Diagnosis not present

## 2019-04-12 DIAGNOSIS — Z03818 Encounter for observation for suspected exposure to other biological agents ruled out: Secondary | ICD-10-CM | POA: Diagnosis not present

## 2019-06-06 ENCOUNTER — Ambulatory Visit: Payer: Federal, State, Local not specified - PPO | Attending: Internal Medicine

## 2019-06-06 DIAGNOSIS — Z23 Encounter for immunization: Secondary | ICD-10-CM

## 2019-06-06 NOTE — Progress Notes (Signed)
   Covid-19 Vaccination Clinic  Name:  QUIARA KILLIAN    MRN: [id number] DOB: 1981/03/20  06/06/2019  Ms. Polack was observed post Covid-19 immunization for 15 minutes without incident. She was provided with Vaccine Information Sheet and instruction to access the V-Safe system.   Ms. Gasparyan was instructed to call 911 with any severe reactions post vaccine: Marland Kitchen Difficulty breathing  . Swelling of face and throat  . A fast heartbeat  . A bad rash all over body  . Dizziness and weakness   Immunizations Administered    Name Date Dose VIS Date Route   Pfizer COVID-19 Vaccine 06/06/2019  8:57 AM 0.3 mL 03/11/2019 Intramuscular   Manufacturer: [employer], [employer]   Lot: [id number]   NDC: [address]-7

## 2019-06-20 DIAGNOSIS — Z20828 Contact with and (suspected) exposure to other viral communicable diseases: Secondary | ICD-10-CM | POA: Diagnosis not present

## 2019-06-20 DIAGNOSIS — Z03818 Encounter for observation for suspected exposure to other biological agents ruled out: Secondary | ICD-10-CM | POA: Diagnosis not present

## 2019-07-05 DIAGNOSIS — Z03818 Encounter for observation for suspected exposure to other biological agents ruled out: Secondary | ICD-10-CM | POA: Diagnosis not present

## 2019-07-05 DIAGNOSIS — I10 Essential (primary) hypertension: Secondary | ICD-10-CM | POA: Diagnosis not present

## 2019-07-05 DIAGNOSIS — Z20828 Contact with and (suspected) exposure to other viral communicable diseases: Secondary | ICD-10-CM | POA: Diagnosis not present

## 2019-07-06 ENCOUNTER — Ambulatory Visit: Payer: Federal, State, Local not specified - PPO

## 2019-07-06 DIAGNOSIS — Z20828 Contact with and (suspected) exposure to other viral communicable diseases: Secondary | ICD-10-CM | POA: Diagnosis not present

## 2019-07-06 DIAGNOSIS — Z20822 Contact with and (suspected) exposure to covid-19: Secondary | ICD-10-CM | POA: Diagnosis not present

## 2019-07-07 DIAGNOSIS — Z20828 Contact with and (suspected) exposure to other viral communicable diseases: Secondary | ICD-10-CM | POA: Diagnosis not present

## 2019-07-07 DIAGNOSIS — J019 Acute sinusitis, unspecified: Secondary | ICD-10-CM | POA: Diagnosis not present

## 2019-07-11 DIAGNOSIS — Z03818 Encounter for observation for suspected exposure to other biological agents ruled out: Secondary | ICD-10-CM | POA: Diagnosis not present

## 2019-07-11 DIAGNOSIS — Z20828 Contact with and (suspected) exposure to other viral communicable diseases: Secondary | ICD-10-CM | POA: Diagnosis not present

## 2019-07-13 ENCOUNTER — Ambulatory Visit: Payer: Federal, State, Local not specified - PPO | Attending: Internal Medicine

## 2019-07-13 DIAGNOSIS — Z23 Encounter for immunization: Secondary | ICD-10-CM

## 2019-07-13 NOTE — Progress Notes (Signed)
   Covid-19 Vaccination Clinic  Name:  Michelle Wise    MRN: [id number] DOB: [birth year]  07/13/2019  Ms. Michelle Wise was observed post Covid-19 immunization for 15 minutes without incident. She was provided with Vaccine Information Sheet and instruction to access the V-Safe system.   Ms. Michelle Wise was instructed to call 911 with any severe reactions post vaccine: Marland Kitchen Difficulty breathing  . Swelling of face and throat  . A fast heartbeat  . A bad rash all over body  . Dizziness and weakness   Immunizations Administered    Name Date Dose VIS Date Route   Pfizer COVID-19 Vaccine 07/13/2019  2:46 PM 0.3 mL 03/11/2019 Intramuscular   Manufacturer: [employer], [employer]   Lot: [id number]   NDC: [address]-7

## 2019-09-15 DIAGNOSIS — J0100 Acute maxillary sinusitis, unspecified: Secondary | ICD-10-CM | POA: Diagnosis not present

## 2019-10-23 DIAGNOSIS — Z20822 Contact with and (suspected) exposure to covid-19: Secondary | ICD-10-CM | POA: Diagnosis not present

## 2019-10-24 DIAGNOSIS — J0111 Acute recurrent frontal sinusitis: Secondary | ICD-10-CM | POA: Diagnosis not present

## 2019-12-13 DIAGNOSIS — R05 Cough: Secondary | ICD-10-CM | POA: Diagnosis not present

## 2020-01-04 DIAGNOSIS — R5382 Chronic fatigue, unspecified: Secondary | ICD-10-CM | POA: Diagnosis not present

## 2020-01-06 DIAGNOSIS — M3500 Sicca syndrome, unspecified: Secondary | ICD-10-CM | POA: Diagnosis not present

## 2020-01-06 DIAGNOSIS — J329 Chronic sinusitis, unspecified: Secondary | ICD-10-CM | POA: Diagnosis not present

## 2020-01-18 DIAGNOSIS — J329 Chronic sinusitis, unspecified: Secondary | ICD-10-CM | POA: Diagnosis not present

## 2020-01-18 DIAGNOSIS — J321 Chronic frontal sinusitis: Secondary | ICD-10-CM | POA: Diagnosis not present

## 2020-02-02 DIAGNOSIS — Z0000 Encounter for general adult medical examination without abnormal findings: Secondary | ICD-10-CM | POA: Diagnosis not present

## 2020-02-02 DIAGNOSIS — Z13220 Encounter for screening for lipoid disorders: Secondary | ICD-10-CM | POA: Diagnosis not present

## 2020-02-02 DIAGNOSIS — Z79899 Other long term (current) drug therapy: Secondary | ICD-10-CM | POA: Diagnosis not present

## 2020-02-02 DIAGNOSIS — I10 Essential (primary) hypertension: Secondary | ICD-10-CM | POA: Diagnosis not present

## 2020-03-21 DIAGNOSIS — K5900 Constipation, unspecified: Secondary | ICD-10-CM | POA: Diagnosis not present

## 2020-03-21 DIAGNOSIS — K921 Melena: Secondary | ICD-10-CM | POA: Diagnosis not present

## 2020-03-28 DIAGNOSIS — N939 Abnormal uterine and vaginal bleeding, unspecified: Secondary | ICD-10-CM | POA: Diagnosis not present

## 2020-04-06 DIAGNOSIS — Z6831 Body mass index (BMI) 31.0-31.9, adult: Secondary | ICD-10-CM | POA: Diagnosis not present

## 2020-04-06 DIAGNOSIS — N926 Irregular menstruation, unspecified: Secondary | ICD-10-CM | POA: Diagnosis not present

## 2020-04-06 DIAGNOSIS — Z01411 Encounter for gynecological examination (general) (routine) with abnormal findings: Secondary | ICD-10-CM | POA: Diagnosis not present

## 2020-04-06 DIAGNOSIS — Z1231 Encounter for screening mammogram for malignant neoplasm of breast: Secondary | ICD-10-CM | POA: Diagnosis not present

## 2020-04-18 DIAGNOSIS — R059 Cough, unspecified: Secondary | ICD-10-CM | POA: Diagnosis not present

## 2020-04-26 DIAGNOSIS — R928 Other abnormal and inconclusive findings on diagnostic imaging of breast: Secondary | ICD-10-CM | POA: Diagnosis not present

## 2020-04-27 DIAGNOSIS — U071 COVID-19: Secondary | ICD-10-CM | POA: Diagnosis not present

## 2020-08-15 ENCOUNTER — Other Ambulatory Visit: Payer: Self-pay | Admitting: Physician Assistant

## 2020-08-15 DIAGNOSIS — R1013 Epigastric pain: Secondary | ICD-10-CM | POA: Diagnosis not present

## 2020-08-15 DIAGNOSIS — K5900 Constipation, unspecified: Secondary | ICD-10-CM | POA: Diagnosis not present

## 2020-08-15 DIAGNOSIS — K625 Hemorrhage of anus and rectum: Secondary | ICD-10-CM | POA: Diagnosis not present

## 2020-08-15 DIAGNOSIS — M3500 Sicca syndrome, unspecified: Secondary | ICD-10-CM | POA: Diagnosis not present

## 2020-09-03 ENCOUNTER — Ambulatory Visit
Admission: RE | Admit: 2020-09-03 | Discharge: 2020-09-03 | Disposition: A | Discharge status: Home / Self Care | Payer: Federal, State, Local not specified - PPO | Source: Ambulatory Visit | Attending: Physician Assistant | Admitting: Physician Assistant

## 2020-09-03 DIAGNOSIS — R1310 Dysphagia, unspecified: Secondary | ICD-10-CM | POA: Diagnosis not present

## 2020-09-03 DIAGNOSIS — R1013 Epigastric pain: Secondary | ICD-10-CM

## 2020-09-03 DIAGNOSIS — K8020 Calculus of gallbladder without cholecystitis without obstruction: Secondary | ICD-10-CM | POA: Diagnosis not present

## 2020-09-27 DIAGNOSIS — R1013 Epigastric pain: Secondary | ICD-10-CM | POA: Diagnosis not present

## 2020-10-23 DIAGNOSIS — R509 Fever, unspecified: Secondary | ICD-10-CM | POA: Diagnosis not present

## 2020-10-24 DIAGNOSIS — M3500 Sicca syndrome, unspecified: Secondary | ICD-10-CM | POA: Diagnosis not present

## 2020-10-24 DIAGNOSIS — I10 Essential (primary) hypertension: Secondary | ICD-10-CM | POA: Diagnosis not present

## 2020-10-24 DIAGNOSIS — K8020 Calculus of gallbladder without cholecystitis without obstruction: Secondary | ICD-10-CM | POA: Diagnosis not present

## 2021-01-13 DIAGNOSIS — M3505 Sjogren syndrome with inflammatory arthritis: Secondary | ICD-10-CM | POA: Diagnosis not present

## 2021-02-05 DIAGNOSIS — I10 Essential (primary) hypertension: Secondary | ICD-10-CM | POA: Diagnosis not present

## 2021-02-05 DIAGNOSIS — M7989 Other specified soft tissue disorders: Secondary | ICD-10-CM | POA: Diagnosis not present

## 2021-02-20 DIAGNOSIS — I10 Essential (primary) hypertension: Secondary | ICD-10-CM | POA: Diagnosis not present

## 2021-02-20 DIAGNOSIS — Z79899 Other long term (current) drug therapy: Secondary | ICD-10-CM | POA: Diagnosis not present

## 2021-02-20 DIAGNOSIS — Z0000 Encounter for general adult medical examination without abnormal findings: Secondary | ICD-10-CM | POA: Diagnosis not present

## 2021-02-20 DIAGNOSIS — Z23 Encounter for immunization: Secondary | ICD-10-CM | POA: Diagnosis not present

## 2021-02-20 DIAGNOSIS — E039 Hypothyroidism, unspecified: Secondary | ICD-10-CM | POA: Diagnosis not present

## 2021-02-20 DIAGNOSIS — M3500 Sicca syndrome, unspecified: Secondary | ICD-10-CM | POA: Diagnosis not present

## 2021-03-01 DIAGNOSIS — D72819 Decreased white blood cell count, unspecified: Secondary | ICD-10-CM | POA: Diagnosis not present

## 2021-03-01 DIAGNOSIS — M3500 Sicca syndrome, unspecified: Secondary | ICD-10-CM | POA: Diagnosis not present

## 2021-03-01 DIAGNOSIS — R5383 Other fatigue: Secondary | ICD-10-CM | POA: Diagnosis not present

## 2021-03-01 DIAGNOSIS — M1990 Unspecified osteoarthritis, unspecified site: Secondary | ICD-10-CM | POA: Diagnosis not present

## 2021-03-01 DIAGNOSIS — M79642 Pain in left hand: Secondary | ICD-10-CM | POA: Diagnosis not present

## 2021-03-11 DIAGNOSIS — K625 Hemorrhage of anus and rectum: Secondary | ICD-10-CM | POA: Diagnosis not present

## 2021-03-18 DIAGNOSIS — M0579 Rheumatoid arthritis with rheumatoid factor of multiple sites without organ or systems involvement: Secondary | ICD-10-CM | POA: Diagnosis not present

## 2021-03-18 DIAGNOSIS — M79641 Pain in right hand: Secondary | ICD-10-CM | POA: Diagnosis not present

## 2021-03-18 DIAGNOSIS — M3500 Sicca syndrome, unspecified: Secondary | ICD-10-CM | POA: Diagnosis not present

## 2021-03-18 DIAGNOSIS — D72819 Decreased white blood cell count, unspecified: Secondary | ICD-10-CM | POA: Diagnosis not present

## 2021-03-18 DIAGNOSIS — M79642 Pain in left hand: Secondary | ICD-10-CM | POA: Diagnosis not present

## 2021-04-03 DIAGNOSIS — J069 Acute upper respiratory infection, unspecified: Secondary | ICD-10-CM | POA: Diagnosis not present

## 2021-04-16 DIAGNOSIS — M0579 Rheumatoid arthritis with rheumatoid factor of multiple sites without organ or systems involvement: Secondary | ICD-10-CM | POA: Diagnosis not present

## 2021-04-23 DIAGNOSIS — D72819 Decreased white blood cell count, unspecified: Secondary | ICD-10-CM | POA: Diagnosis not present

## 2021-04-23 DIAGNOSIS — M79642 Pain in left hand: Secondary | ICD-10-CM | POA: Diagnosis not present

## 2021-04-23 DIAGNOSIS — M3500 Sicca syndrome, unspecified: Secondary | ICD-10-CM | POA: Diagnosis not present

## 2021-04-23 DIAGNOSIS — M0579 Rheumatoid arthritis with rheumatoid factor of multiple sites without organ or systems involvement: Secondary | ICD-10-CM | POA: Diagnosis not present

## 2021-05-22 DIAGNOSIS — M79642 Pain in left hand: Secondary | ICD-10-CM | POA: Diagnosis not present

## 2021-05-22 DIAGNOSIS — M79641 Pain in right hand: Secondary | ICD-10-CM | POA: Diagnosis not present

## 2021-05-22 DIAGNOSIS — M0579 Rheumatoid arthritis with rheumatoid factor of multiple sites without organ or systems involvement: Secondary | ICD-10-CM | POA: Diagnosis not present

## 2021-05-22 DIAGNOSIS — M3500 Sicca syndrome, unspecified: Secondary | ICD-10-CM | POA: Diagnosis not present

## 2021-05-30 DIAGNOSIS — K080 Exfoliation of teeth due to systemic causes: Secondary | ICD-10-CM | POA: Diagnosis not present

## 2021-06-03 DIAGNOSIS — M0579 Rheumatoid arthritis with rheumatoid factor of multiple sites without organ or systems involvement: Secondary | ICD-10-CM | POA: Diagnosis not present

## 2021-06-18 DIAGNOSIS — I10 Essential (primary) hypertension: Secondary | ICD-10-CM | POA: Diagnosis not present

## 2021-07-01 DIAGNOSIS — M0579 Rheumatoid arthritis with rheumatoid factor of multiple sites without organ or systems involvement: Secondary | ICD-10-CM | POA: Diagnosis not present

## 2021-07-01 DIAGNOSIS — R7989 Other specified abnormal findings of blood chemistry: Secondary | ICD-10-CM | POA: Diagnosis not present

## 2021-07-18 DIAGNOSIS — M79642 Pain in left hand: Secondary | ICD-10-CM | POA: Diagnosis not present

## 2021-07-18 DIAGNOSIS — D72819 Decreased white blood cell count, unspecified: Secondary | ICD-10-CM | POA: Diagnosis not present

## 2021-07-18 DIAGNOSIS — M3500 Sicca syndrome, unspecified: Secondary | ICD-10-CM | POA: Diagnosis not present

## 2021-07-18 DIAGNOSIS — M0579 Rheumatoid arthritis with rheumatoid factor of multiple sites without organ or systems involvement: Secondary | ICD-10-CM | POA: Diagnosis not present

## 2021-08-27 DIAGNOSIS — M0579 Rheumatoid arthritis with rheumatoid factor of multiple sites without organ or systems involvement: Secondary | ICD-10-CM | POA: Diagnosis not present

## 2021-09-03 DIAGNOSIS — M3500 Sicca syndrome, unspecified: Secondary | ICD-10-CM | POA: Diagnosis not present

## 2021-09-03 DIAGNOSIS — M0579 Rheumatoid arthritis with rheumatoid factor of multiple sites without organ or systems involvement: Secondary | ICD-10-CM | POA: Diagnosis not present

## 2021-09-03 DIAGNOSIS — M79642 Pain in left hand: Secondary | ICD-10-CM | POA: Diagnosis not present

## 2021-09-03 DIAGNOSIS — D72819 Decreased white blood cell count, unspecified: Secondary | ICD-10-CM | POA: Diagnosis not present

## 2021-09-27 DIAGNOSIS — Z124 Encounter for screening for malignant neoplasm of cervix: Secondary | ICD-10-CM | POA: Diagnosis not present

## 2021-09-27 DIAGNOSIS — Z6834 Body mass index (BMI) 34.0-34.9, adult: Secondary | ICD-10-CM | POA: Diagnosis not present

## 2021-09-27 DIAGNOSIS — Z01411 Encounter for gynecological examination (general) (routine) with abnormal findings: Secondary | ICD-10-CM | POA: Diagnosis not present

## 2021-09-27 DIAGNOSIS — R928 Other abnormal and inconclusive findings on diagnostic imaging of breast: Secondary | ICD-10-CM | POA: Diagnosis not present

## 2021-10-03 DIAGNOSIS — M79641 Pain in right hand: Secondary | ICD-10-CM | POA: Diagnosis not present

## 2021-10-03 DIAGNOSIS — M79642 Pain in left hand: Secondary | ICD-10-CM | POA: Diagnosis not present

## 2021-10-03 DIAGNOSIS — M0579 Rheumatoid arthritis with rheumatoid factor of multiple sites without organ or systems involvement: Secondary | ICD-10-CM | POA: Diagnosis not present

## 2021-10-03 DIAGNOSIS — M3500 Sicca syndrome, unspecified: Secondary | ICD-10-CM | POA: Diagnosis not present

## 2021-10-23 DIAGNOSIS — M0579 Rheumatoid arthritis with rheumatoid factor of multiple sites without organ or systems involvement: Secondary | ICD-10-CM | POA: Diagnosis not present

## 2021-10-23 DIAGNOSIS — Z79899 Other long term (current) drug therapy: Secondary | ICD-10-CM | POA: Diagnosis not present

## 2021-11-20 DIAGNOSIS — R928 Other abnormal and inconclusive findings on diagnostic imaging of breast: Secondary | ICD-10-CM | POA: Diagnosis not present

## 2021-11-21 DIAGNOSIS — M25561 Pain in right knee: Secondary | ICD-10-CM | POA: Diagnosis not present

## 2021-11-26 DIAGNOSIS — M1990 Unspecified osteoarthritis, unspecified site: Secondary | ICD-10-CM | POA: Diagnosis not present

## 2021-11-26 DIAGNOSIS — M3500 Sicca syndrome, unspecified: Secondary | ICD-10-CM | POA: Diagnosis not present

## 2021-12-18 DIAGNOSIS — M0579 Rheumatoid arthritis with rheumatoid factor of multiple sites without organ or systems involvement: Secondary | ICD-10-CM | POA: Diagnosis not present

## 2021-12-18 DIAGNOSIS — M25561 Pain in right knee: Secondary | ICD-10-CM | POA: Diagnosis not present

## 2021-12-19 DIAGNOSIS — M79641 Pain in right hand: Secondary | ICD-10-CM | POA: Diagnosis not present

## 2021-12-19 DIAGNOSIS — M3500 Sicca syndrome, unspecified: Secondary | ICD-10-CM | POA: Diagnosis not present

## 2021-12-19 DIAGNOSIS — M79642 Pain in left hand: Secondary | ICD-10-CM | POA: Diagnosis not present

## 2021-12-19 DIAGNOSIS — M0579 Rheumatoid arthritis with rheumatoid factor of multiple sites without organ or systems involvement: Secondary | ICD-10-CM | POA: Diagnosis not present

## 2021-12-21 IMAGING — US US ABDOMEN LIMITED
1 series · 14 of 25 positions shown · non-contrast
Comparison: None

CLINICAL DATA: Dyspepsia since age 19

EXAM:
ULTRASOUND ABDOMEN LIMITED RIGHT UPPER QUADRANT

[Series 1: us abdomen limited · 0.20mm/px · 14 of 44 slices shown]
[im 1/44]
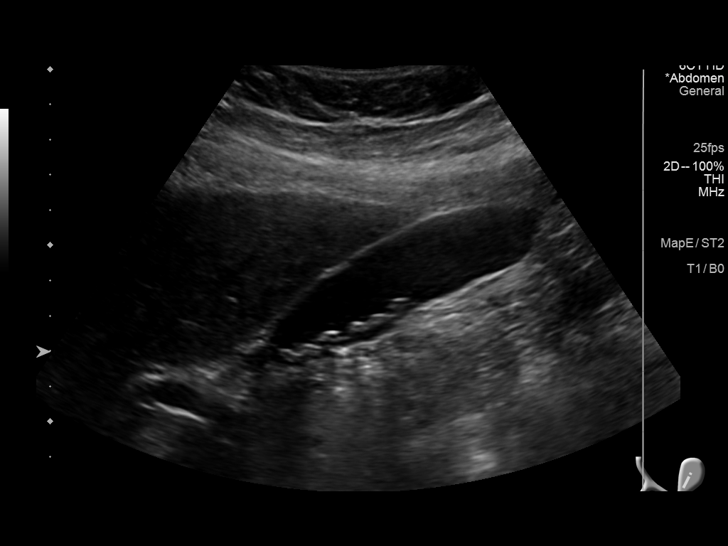
[im 4/44]
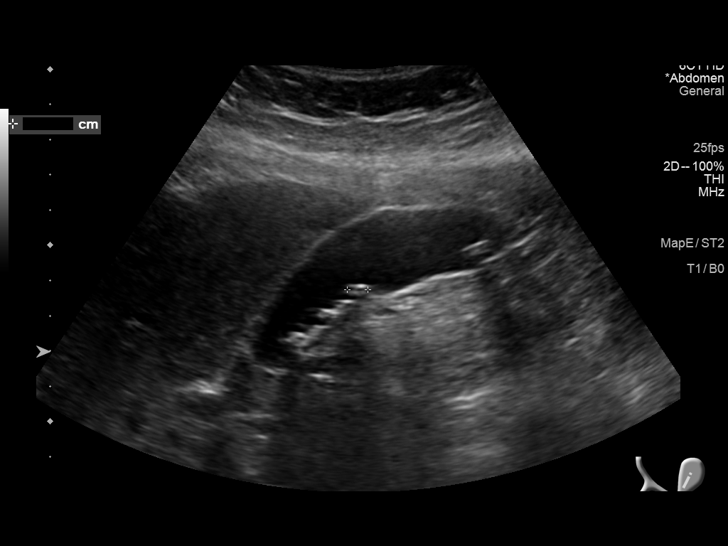
[im 8/44]
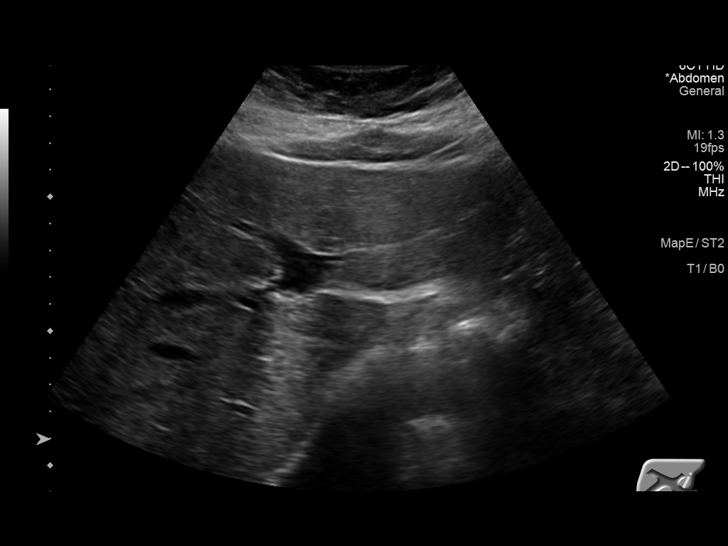
[im 11/44]
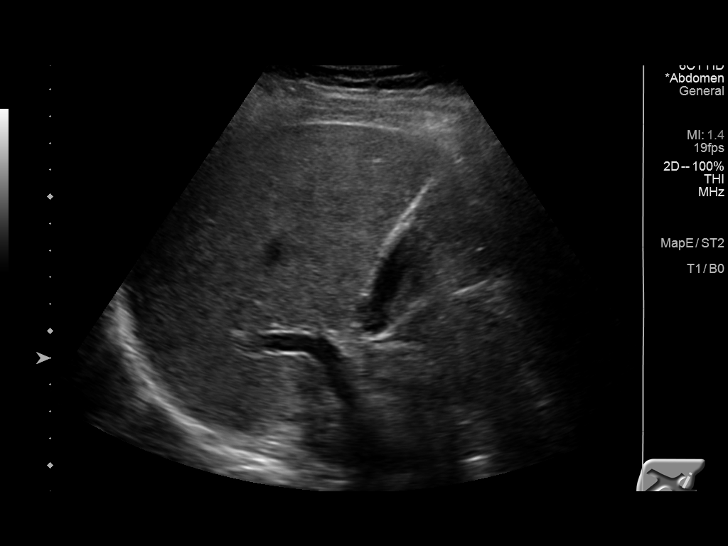
[im 15/44]
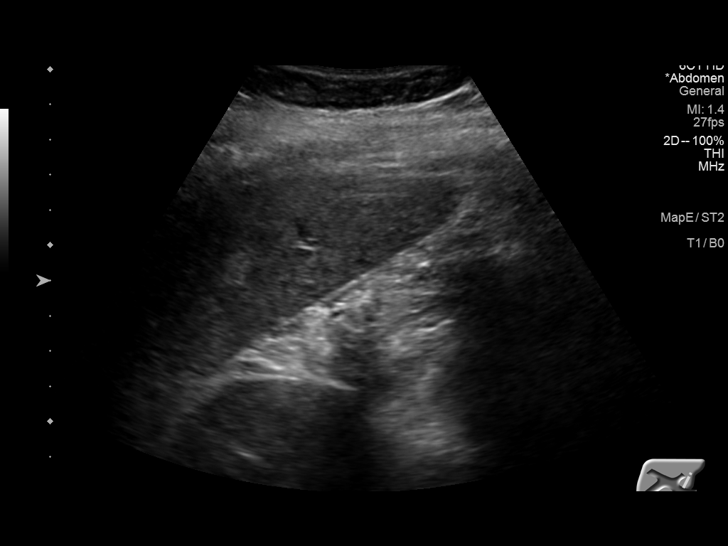
[im 17/44]
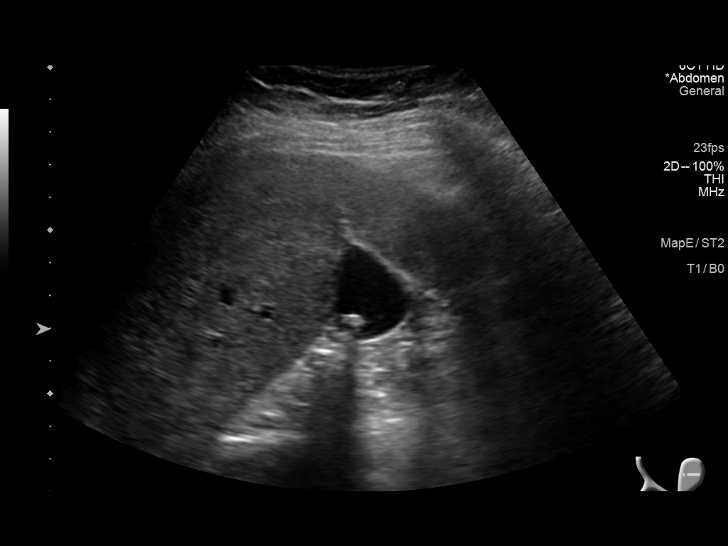
[im 20/44]
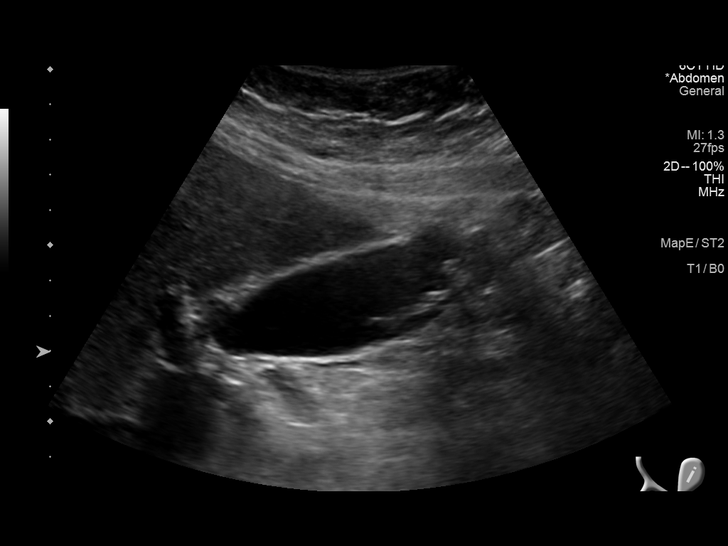
[im 24/44]
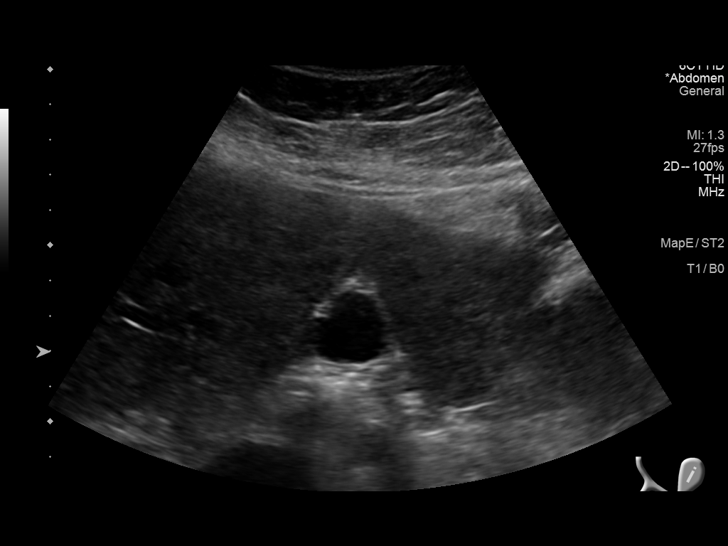
[im 27/44]
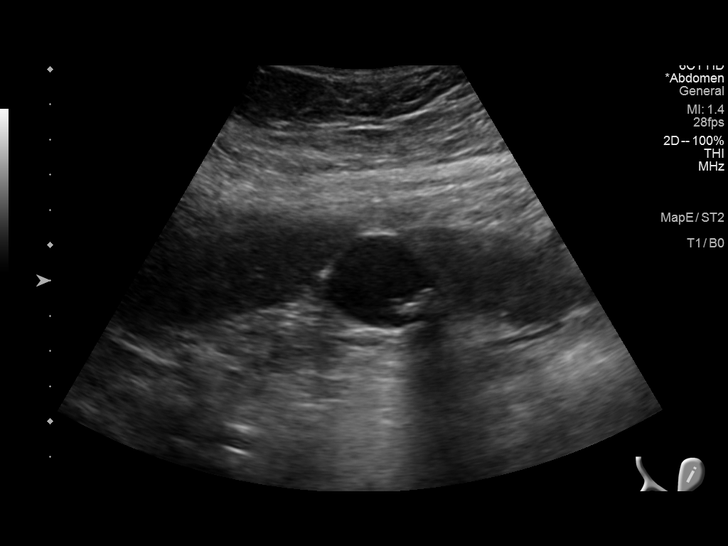
[im 29/44]
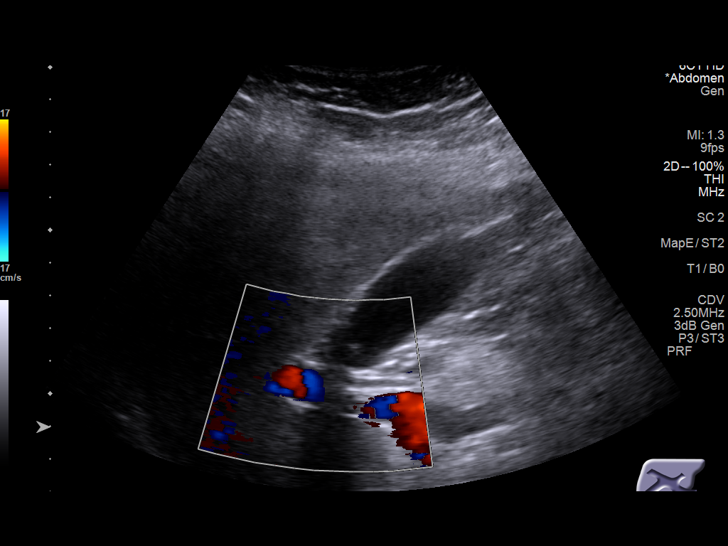
[im 33/44]
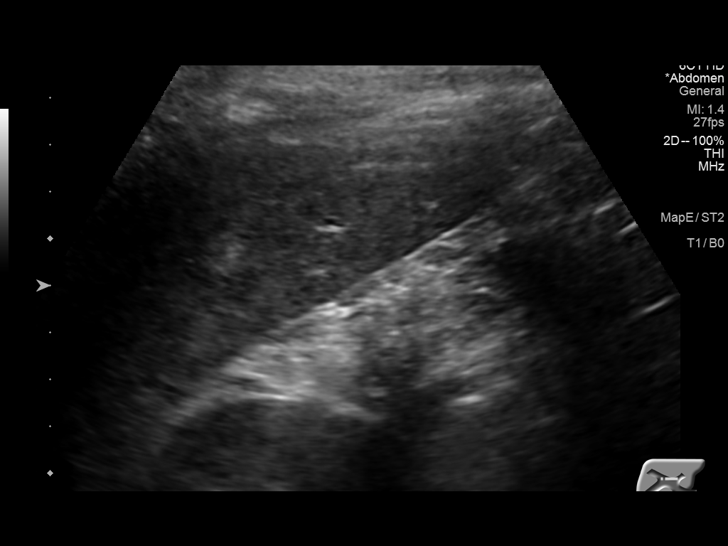
[im 36/44]
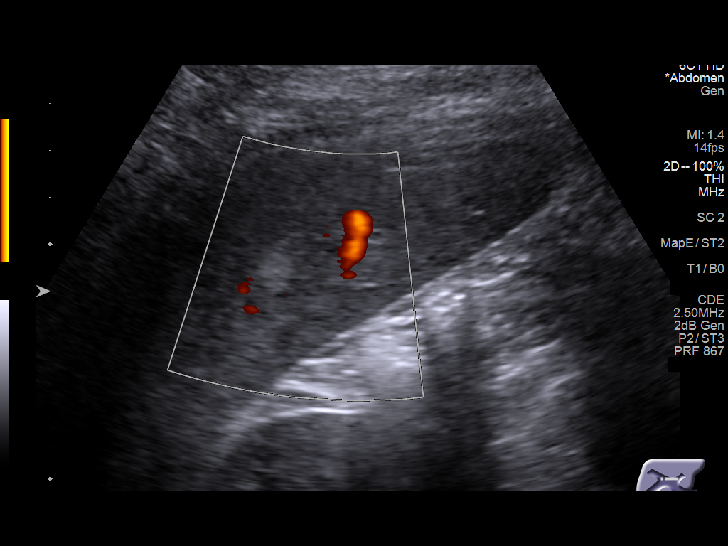
[im 40/44]
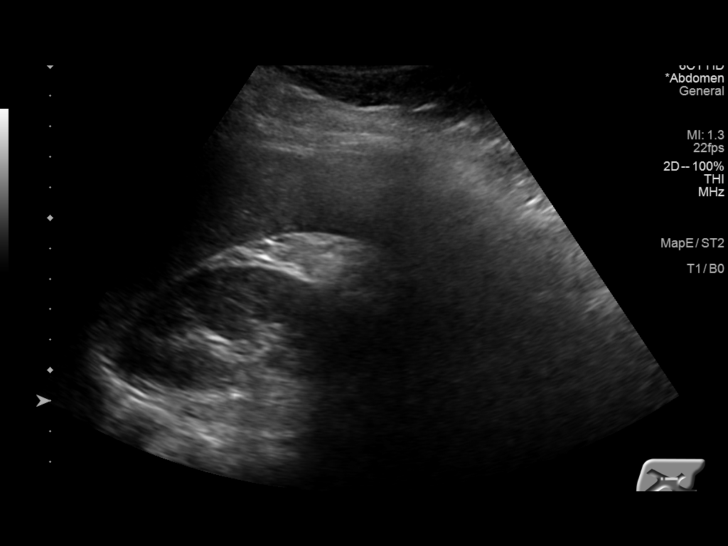
[im 44/44]
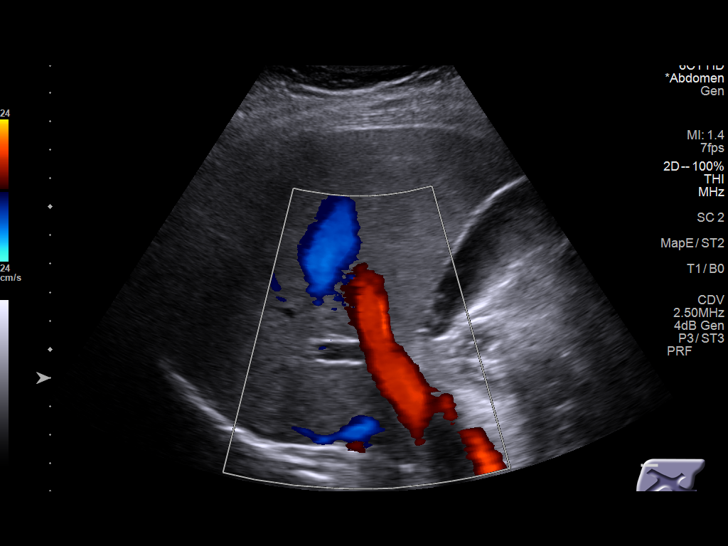

[14 of 25 positions shown; findings below may reference images not displayed]

FINDINGS: Gallbladder:

Small dependent shadowing calculi within gallbladder up to 6 mm
diameter. Normal gallbladder wall thickness. No pericholecystic
fluid or sonographic Murphy sign.

Common bile duct:

Diameter: 2 mm diameter, normal

Liver:

Slightly coarsened parenchymal echogenicity. Small hyperechoic
nodule 11 x 11 8 x 8 mm question hemangioma no additional hepatic
sonographic abnormalities. No contour nodularity or intrahepatic
biliary dilatation. Portal vein is patent on color Doppler imaging
with normal direction of blood flow towards the liver.

Other: No RIGHT upper quadrant free fluid.
IMPRESSION: Cholelithiasis.

Probable 11 mm hemangioma RIGHT lobe liver.

Otherwise negative exam.

## 2021-12-29 DIAGNOSIS — M25561 Pain in right knee: Secondary | ICD-10-CM | POA: Diagnosis not present

## 2022-01-07 DIAGNOSIS — M25561 Pain in right knee: Secondary | ICD-10-CM | POA: Diagnosis not present

## 2022-01-09 DIAGNOSIS — Z79899 Other long term (current) drug therapy: Secondary | ICD-10-CM | POA: Diagnosis not present

## 2022-01-09 DIAGNOSIS — M3500 Sicca syndrome, unspecified: Secondary | ICD-10-CM | POA: Diagnosis not present

## 2022-01-09 DIAGNOSIS — D72819 Decreased white blood cell count, unspecified: Secondary | ICD-10-CM | POA: Diagnosis not present

## 2022-01-09 DIAGNOSIS — M0579 Rheumatoid arthritis with rheumatoid factor of multiple sites without organ or systems involvement: Secondary | ICD-10-CM | POA: Diagnosis not present

## 2022-01-15 DIAGNOSIS — T148XXA Other injury of unspecified body region, initial encounter: Secondary | ICD-10-CM | POA: Diagnosis not present

## 2022-01-15 DIAGNOSIS — M545 Low back pain, unspecified: Secondary | ICD-10-CM | POA: Diagnosis not present

## 2022-02-12 DIAGNOSIS — M0579 Rheumatoid arthritis with rheumatoid factor of multiple sites without organ or systems involvement: Secondary | ICD-10-CM | POA: Diagnosis not present

## 2022-03-03 DIAGNOSIS — M3500 Sicca syndrome, unspecified: Secondary | ICD-10-CM | POA: Diagnosis not present

## 2022-03-03 DIAGNOSIS — Z79899 Other long term (current) drug therapy: Secondary | ICD-10-CM | POA: Diagnosis not present

## 2022-03-03 DIAGNOSIS — I10 Essential (primary) hypertension: Secondary | ICD-10-CM | POA: Diagnosis not present

## 2022-03-03 DIAGNOSIS — Z23 Encounter for immunization: Secondary | ICD-10-CM | POA: Diagnosis not present

## 2022-03-03 DIAGNOSIS — Z0000 Encounter for general adult medical examination without abnormal findings: Secondary | ICD-10-CM | POA: Diagnosis not present

## 2022-03-03 DIAGNOSIS — E039 Hypothyroidism, unspecified: Secondary | ICD-10-CM | POA: Diagnosis not present

## 2022-03-07 DIAGNOSIS — Z23 Encounter for immunization: Secondary | ICD-10-CM | POA: Diagnosis not present

## 2022-03-15 DIAGNOSIS — F419 Anxiety disorder, unspecified: Secondary | ICD-10-CM | POA: Diagnosis not present

## 2022-04-02 DIAGNOSIS — M3500 Sicca syndrome, unspecified: Secondary | ICD-10-CM | POA: Diagnosis not present

## 2022-04-02 DIAGNOSIS — Z79899 Other long term (current) drug therapy: Secondary | ICD-10-CM | POA: Diagnosis not present

## 2022-04-02 DIAGNOSIS — M3501 Sicca syndrome with keratoconjunctivitis: Secondary | ICD-10-CM | POA: Diagnosis not present

## 2022-04-02 DIAGNOSIS — R5383 Other fatigue: Secondary | ICD-10-CM | POA: Diagnosis not present

## 2022-04-02 DIAGNOSIS — M0579 Rheumatoid arthritis with rheumatoid factor of multiple sites without organ or systems involvement: Secondary | ICD-10-CM | POA: Diagnosis not present

## 2022-04-26 DIAGNOSIS — F4320 Adjustment disorder, unspecified: Secondary | ICD-10-CM | POA: Diagnosis not present

## 2022-05-03 DIAGNOSIS — R8290 Unspecified abnormal findings in urine: Secondary | ICD-10-CM | POA: Diagnosis not present

## 2022-05-03 DIAGNOSIS — B3731 Acute candidiasis of vulva and vagina: Secondary | ICD-10-CM | POA: Diagnosis not present

## 2022-05-07 DIAGNOSIS — N898 Other specified noninflammatory disorders of vagina: Secondary | ICD-10-CM | POA: Diagnosis not present

## 2022-05-07 DIAGNOSIS — Z113 Encounter for screening for infections with a predominantly sexual mode of transmission: Secondary | ICD-10-CM | POA: Diagnosis not present

## 2022-05-16 DIAGNOSIS — R7989 Other specified abnormal findings of blood chemistry: Secondary | ICD-10-CM | POA: Diagnosis not present

## 2022-05-26 DIAGNOSIS — M0579 Rheumatoid arthritis with rheumatoid factor of multiple sites without organ or systems involvement: Secondary | ICD-10-CM | POA: Diagnosis not present

## 2022-06-18 DIAGNOSIS — R7989 Other specified abnormal findings of blood chemistry: Secondary | ICD-10-CM | POA: Diagnosis not present

## 2022-06-18 DIAGNOSIS — Z79899 Other long term (current) drug therapy: Secondary | ICD-10-CM | POA: Diagnosis not present

## 2022-07-02 DIAGNOSIS — M0579 Rheumatoid arthritis with rheumatoid factor of multiple sites without organ or systems involvement: Secondary | ICD-10-CM | POA: Diagnosis not present

## 2022-07-02 DIAGNOSIS — M79642 Pain in left hand: Secondary | ICD-10-CM | POA: Diagnosis not present

## 2022-07-02 DIAGNOSIS — M3501 Sicca syndrome with keratoconjunctivitis: Secondary | ICD-10-CM | POA: Diagnosis not present

## 2022-07-02 DIAGNOSIS — M79641 Pain in right hand: Secondary | ICD-10-CM | POA: Diagnosis not present

## 2022-07-03 DIAGNOSIS — M1990 Unspecified osteoarthritis, unspecified site: Secondary | ICD-10-CM | POA: Diagnosis not present

## 2022-07-03 DIAGNOSIS — M25561 Pain in right knee: Secondary | ICD-10-CM | POA: Diagnosis not present

## 2022-07-03 DIAGNOSIS — M3500 Sicca syndrome, unspecified: Secondary | ICD-10-CM | POA: Diagnosis not present

## 2022-07-24 DIAGNOSIS — M0579 Rheumatoid arthritis with rheumatoid factor of multiple sites without organ or systems involvement: Secondary | ICD-10-CM | POA: Diagnosis not present

## 2022-07-24 DIAGNOSIS — Z79899 Other long term (current) drug therapy: Secondary | ICD-10-CM | POA: Diagnosis not present

## 2022-09-17 DIAGNOSIS — Z79899 Other long term (current) drug therapy: Secondary | ICD-10-CM | POA: Diagnosis not present

## 2022-09-17 DIAGNOSIS — M79642 Pain in left hand: Secondary | ICD-10-CM | POA: Diagnosis not present

## 2022-09-17 DIAGNOSIS — M3501 Sicca syndrome with keratoconjunctivitis: Secondary | ICD-10-CM | POA: Diagnosis not present

## 2022-09-17 DIAGNOSIS — M0579 Rheumatoid arthritis with rheumatoid factor of multiple sites without organ or systems involvement: Secondary | ICD-10-CM | POA: Diagnosis not present

## 2022-09-17 DIAGNOSIS — M25561 Pain in right knee: Secondary | ICD-10-CM | POA: Diagnosis not present

## 2022-12-18 DIAGNOSIS — M0579 Rheumatoid arthritis with rheumatoid factor of multiple sites without organ or systems involvement: Secondary | ICD-10-CM | POA: Diagnosis not present

## 2022-12-18 DIAGNOSIS — M25561 Pain in right knee: Secondary | ICD-10-CM | POA: Diagnosis not present

## 2022-12-18 DIAGNOSIS — M79642 Pain in left hand: Secondary | ICD-10-CM | POA: Diagnosis not present

## 2022-12-18 DIAGNOSIS — Z79899 Other long term (current) drug therapy: Secondary | ICD-10-CM | POA: Diagnosis not present

## 2022-12-18 DIAGNOSIS — M3501 Sicca syndrome with keratoconjunctivitis: Secondary | ICD-10-CM | POA: Diagnosis not present

## 2022-12-30 ENCOUNTER — Ambulatory Visit: Payer: Federal, State, Local not specified - PPO | Admitting: Dermatology

## 2022-12-30 DIAGNOSIS — J019 Acute sinusitis, unspecified: Secondary | ICD-10-CM | POA: Diagnosis not present

## 2023-03-09 DIAGNOSIS — M3500 Sicca syndrome, unspecified: Secondary | ICD-10-CM | POA: Diagnosis not present

## 2023-03-09 DIAGNOSIS — E039 Hypothyroidism, unspecified: Secondary | ICD-10-CM | POA: Diagnosis not present

## 2023-03-09 DIAGNOSIS — I10 Essential (primary) hypertension: Secondary | ICD-10-CM | POA: Diagnosis not present

## 2023-03-09 DIAGNOSIS — Z0000 Encounter for general adult medical examination without abnormal findings: Secondary | ICD-10-CM | POA: Diagnosis not present

## 2023-03-09 DIAGNOSIS — Z131 Encounter for screening for diabetes mellitus: Secondary | ICD-10-CM | POA: Diagnosis not present

## 2023-03-23 DIAGNOSIS — M3501 Sicca syndrome with keratoconjunctivitis: Secondary | ICD-10-CM | POA: Diagnosis not present

## 2023-03-23 DIAGNOSIS — M0579 Rheumatoid arthritis with rheumatoid factor of multiple sites without organ or systems involvement: Secondary | ICD-10-CM | POA: Diagnosis not present

## 2023-03-23 DIAGNOSIS — Z79899 Other long term (current) drug therapy: Secondary | ICD-10-CM | POA: Diagnosis not present

## 2023-03-23 DIAGNOSIS — R5383 Other fatigue: Secondary | ICD-10-CM | POA: Diagnosis not present

## 2023-03-30 DIAGNOSIS — D849 Immunodeficiency, unspecified: Secondary | ICD-10-CM | POA: Diagnosis not present

## 2023-03-30 DIAGNOSIS — J0111 Acute recurrent frontal sinusitis: Secondary | ICD-10-CM | POA: Diagnosis not present

## 2023-04-15 DIAGNOSIS — E039 Hypothyroidism, unspecified: Secondary | ICD-10-CM | POA: Diagnosis not present

## 2023-05-05 DIAGNOSIS — R350 Frequency of micturition: Secondary | ICD-10-CM | POA: Diagnosis not present

## 2023-05-06 DIAGNOSIS — E669 Obesity, unspecified: Secondary | ICD-10-CM | POA: Diagnosis not present

## 2023-05-06 DIAGNOSIS — I10 Essential (primary) hypertension: Secondary | ICD-10-CM | POA: Diagnosis not present

## 2023-05-22 DIAGNOSIS — H04123 Dry eye syndrome of bilateral lacrimal glands: Secondary | ICD-10-CM | POA: Diagnosis not present

## 2023-05-22 DIAGNOSIS — Z79899 Other long term (current) drug therapy: Secondary | ICD-10-CM | POA: Diagnosis not present

## 2023-06-08 DIAGNOSIS — U071 COVID-19: Secondary | ICD-10-CM | POA: Diagnosis not present

## 2023-06-22 DIAGNOSIS — Z79899 Other long term (current) drug therapy: Secondary | ICD-10-CM | POA: Diagnosis not present

## 2023-06-22 DIAGNOSIS — M3501 Sicca syndrome with keratoconjunctivitis: Secondary | ICD-10-CM | POA: Diagnosis not present

## 2023-06-22 DIAGNOSIS — M0579 Rheumatoid arthritis with rheumatoid factor of multiple sites without organ or systems involvement: Secondary | ICD-10-CM | POA: Diagnosis not present

## 2023-06-25 ENCOUNTER — Ambulatory Visit: Payer: Federal, State, Local not specified - PPO | Admitting: Dermatology

## 2023-07-14 DIAGNOSIS — M0579 Rheumatoid arthritis with rheumatoid factor of multiple sites without organ or systems involvement: Secondary | ICD-10-CM | POA: Diagnosis not present

## 2023-08-01 ENCOUNTER — Other Ambulatory Visit: Payer: Self-pay

## 2023-08-01 ENCOUNTER — Encounter (HOSPITAL_COMMUNITY): Payer: Self-pay | Admitting: Emergency Medicine

## 2023-08-01 ENCOUNTER — Emergency Department (HOSPITAL_COMMUNITY)
Admission: EM | Admit: 2023-08-01 | Discharge: 2023-08-01 | Disposition: A | Discharge status: Home / Self Care | Attending: Emergency Medicine | Admitting: Emergency Medicine

## 2023-08-01 DIAGNOSIS — H538 Other visual disturbances: Secondary | ICD-10-CM

## 2023-08-01 DIAGNOSIS — S0501XA Injury of conjunctiva and corneal abrasion without foreign body, right eye, initial encounter: Secondary | ICD-10-CM | POA: Diagnosis not present

## 2023-08-01 DIAGNOSIS — H18821 Corneal disorder due to contact lens, right eye: Secondary | ICD-10-CM | POA: Diagnosis not present

## 2023-08-01 MED ORDER — TETRACAINE HCL 0.5 % OP SOLN
2.0000 [drp] | Freq: Once | OPHTHALMIC | Status: AC
  Administered 2023-08-01: 2 [drp] via OPHTHALMIC
  Filled 2023-08-01: qty 4

## 2023-08-01 MED ORDER — CIPROFLOXACIN HCL 0.3 % OP SOLN
2.0000 [drp] | OPHTHALMIC | Status: DC
  Administered 2023-08-01: 2 [drp] via OPHTHALMIC
  Filled 2023-08-01: qty 2.5

## 2023-08-01 MED ORDER — FLUORESCEIN SODIUM 1 MG OP STRP
1.0000 | ORAL_STRIP | Freq: Once | OPHTHALMIC | Status: AC
  Administered 2023-08-01: 1 via OPHTHALMIC
  Filled 2023-08-01: qty 1

## 2023-08-01 NOTE — ED Provider Notes (Signed)
 [place] [facility] Provider Note   CSN: [id number] Arrival date & time: 08/01/23  0940     History  Chief Complaint  Patient presents with   Blurred Vision    Michelle Wise is a 43 y.o. female.  HPI   43 year old female presents today complaining of pain and blurring to right eye.  Patient accidentally slipped in her contact lenses last night.  This morning the eye was somewhat painful and she was able to remove the contact lens.  She has had some blurring of vision in the right eye and some pain since that time.  She does not normally sleep in her contact lenses.  Home Medications Prior to Admission medications   Medication Sig Start Date End Date Taking? Authorizing Provider  amLODipine (NORVASC) 5 MG tablet Take 5 mg by mouth daily. 10/12/17   [provider]  Ascorbic Acid (VITAMIN C PO) Take 1 tablet by mouth daily.    [provider]  Cetirizine HCl (ZYRTEC PO) Take 1 tablet by mouth daily.    [provider]  Cholecalciferol (VITAMIN D PO) Take 1 tablet by mouth daily.    [provider]  Cyanocobalamin  (B-12 PO) Take 1 tablet by mouth daily.    [provider]  levothyroxine (SYNTHROID, LEVOTHROID) 100 MCG tablet Take 100 mcg by mouth every morning. 10/15/17   [provider]  ondansetron  (ZOFRAN  ODT) 4 MG disintegrating tablet 4mg  ODT q4 hours prn nausea/vomit 11/01/17   Zammit, Joseph, MD  pantoprazole  (PROTONIX ) 20 MG tablet Take 1 tablet (20 mg total) by mouth daily. 11/01/17   Zammit, Joseph, MD  senna (SENOKOT) 8.6 MG tablet Take 2 tablets by mouth daily as needed for constipation.    [provider]      Allergies    Patient has no known allergies.    Review of Systems   Review of Systems  Physical Exam Updated Vital Signs BP 126/84   Pulse 97   Temp 98.5 F (36.9 C) (Oral)   Resp 17   Ht 1.651 m (5\' 5" )   Wt 90.7 kg   LMP 07/14/2023   SpO2 99%   BMI  33.28 kg/m  Physical Exam Vitals reviewed.  HENT:     Head: Normocephalic.     Right Ear: External ear normal.     Left Ear: External ear normal.     Nose: Nose normal.     Mouth/Throat:     Pharynx: Oropharynx is clear.  Eyes:     General: Lids are normal. Vision grossly intact. Gaze aligned appropriately.        Right eye: No foreign body or discharge.     Extraocular Movements: Extraocular movements intact.     Pupils: Pupils are equal, round, and reactive to light.     Slit lamp exam:    Right eye: No corneal ulcer, foreign body or hyphema.  Neurological:     Mental Status: She is alert.     ED Results / Procedures / Treatments   Labs (all labs ordered are listed, but only abnormal results are displayed) Labs Reviewed - No data to display  EKG None  Radiology No results found.  Procedures Procedures    Medications Ordered in ED Medications  ciprofloxacin (CILOXAN) 0.3 % ophthalmic solution 2 drop (has no administration in time range)  fluorescein ophthalmic strip 1 strip (1 strip Right Eye Given 08/01/23 1236)  tetracaine (PONTOCAINE) 0.5 % ophthalmic solution 2  drop (2 drops Right Eye Given 08/01/23 1236)    ED Course/ Medical Decision Making/ A&P                                 Medical Decision Making Risk Prescription drug management.   43 year old female contact lens wearer who slept in her contact lens last night.  She she has some pain and blurring of vision since removing her contact lens.  On exam there is no evidence corneal ulcer some uptake c.w abrasion.  Pupils are equal and intact.  No evidence of hyphema. Plan antibiotic drops and patient to follow-up with her eye doctor. We discussed return precautions.  We discussed not wearing her contact lenses again until she is seen by her eye doctor.        Final Clinical Impression(s) / ED Diagnoses Final diagnoses:  Blurred vision, right eye  Corneal abrasion of right eye due to contact lens     Rx / DC Orders ED Discharge Orders     None         Auston Blush, MD 08/01/23 1324

## 2023-08-01 NOTE — Discharge Instructions (Signed)
 You are seen here today for blurred vision and pain in your right eye after contact lenses being in all night.  Appears that you may have a small abrasion to the cornea from the contact lens.  There is no evidence of corneal ulcer which is a more severe injury.  However, you should not put your contact lenses in again until you are reseen by an eye doctor.  Please use the antibiotic drops as given here.  You should use them every 4 hours 2 drops while awake.  Please return if you are having any worsening symptoms.

## 2023-08-01 NOTE — ED Triage Notes (Signed)
 Pt reports blurred vision in right eye. Pt states she slept in her contacts last night. No droop, drift, slurred speech. VSS.

## 2023-09-22 DIAGNOSIS — M3501 Sicca syndrome with keratoconjunctivitis: Secondary | ICD-10-CM | POA: Diagnosis not present

## 2023-09-22 DIAGNOSIS — Z79899 Other long term (current) drug therapy: Secondary | ICD-10-CM | POA: Diagnosis not present

## 2023-09-22 DIAGNOSIS — M0579 Rheumatoid arthritis with rheumatoid factor of multiple sites without organ or systems involvement: Secondary | ICD-10-CM | POA: Diagnosis not present

## 2023-10-21 ENCOUNTER — Inpatient Hospital Stay: Admission: RE | Admit: 2023-10-21 | Payer: Self-pay | Source: Ambulatory Visit

## 2023-12-23 DIAGNOSIS — M3501 Sicca syndrome with keratoconjunctivitis: Secondary | ICD-10-CM | POA: Diagnosis not present

## 2023-12-23 DIAGNOSIS — M0579 Rheumatoid arthritis with rheumatoid factor of multiple sites without organ or systems involvement: Secondary | ICD-10-CM | POA: Diagnosis not present

## 2023-12-23 DIAGNOSIS — M25561 Pain in right knee: Secondary | ICD-10-CM | POA: Diagnosis not present

## 2023-12-23 DIAGNOSIS — Z79899 Other long term (current) drug therapy: Secondary | ICD-10-CM | POA: Diagnosis not present

## 2024-02-22 ENCOUNTER — Ambulatory Visit
Admission: EM | Admit: 2024-02-22 | Discharge: 2024-02-22 | Disposition: A | Discharge status: Home / Self Care | Attending: Nurse Practitioner | Admitting: Nurse Practitioner

## 2024-02-22 ENCOUNTER — Encounter: Payer: Self-pay | Admitting: Nurse Practitioner

## 2024-02-22 DIAGNOSIS — R300 Dysuria: Secondary | ICD-10-CM | POA: Insufficient documentation

## 2024-02-22 DIAGNOSIS — R8290 Unspecified abnormal findings in urine: Secondary | ICD-10-CM | POA: Insufficient documentation

## 2024-02-22 LAB — POCT URINE DIPSTICK
Bilirubin, UA: NEGATIVE
Glucose, UA: NEGATIVE mg/dL
Ketones, POC UA: NEGATIVE mg/dL
Nitrite, UA: NEGATIVE
POC PROTEIN,UA: NEGATIVE
Spec Grav, UA: <=1.005 — AB (ref 1.010–1.025)
Urobilinogen, UA: 0.2 U/dL
pH, UA: 5.5 (ref 5.0–8.0)

## 2024-02-22 MED ORDER — FLUCONAZOLE 150 MG PO TABS: 150.0000 mg | ORAL_TABLET | Freq: Every day | ORAL | 0 refills | Status: AC

## 2024-02-22 MED ORDER — NITROFURANTOIN MONOHYD MACRO 100 MG PO CAPS: 100.0000 mg | ORAL_CAPSULE | Freq: Two times a day (BID) | ORAL | 0 refills | Status: AC

## 2024-02-22 NOTE — ED Provider Notes (Signed)
 [facility]    CSN: [id number] Arrival date & time: 02/22/24  9055      History   Chief Complaint No chief complaint on file.   HPI Michelle Wise is a 43 y.o. female.   Patient presents for evaluation of intermittent dysuria and urinary pressure/frequency that has developed over the past week.  Patient states the symptoms intensified last night which prompted her to come in for evaluation.  No associated fever, overt abdominal pain, vomiting, or diarrhea.  She denies any abnormal vaginal discharge or bleeding.  She is sexually active with 1 female partner.  No overt concerns for sexually transmitted infections at this time.  She has not taken any medications for the symptoms.  The history is provided by the patient.    Past Medical History:  Diagnosis Date   Abnormal Pap smear 2006-7   Asthma    Bladder infection    Cervical intraepithelial neoplasia 07/28/2008   Chronic constipation    CIN II (cervical intraepithelial neoplasia II) 05/2008   Depression    H/O varicella    Herpes simplex type 2 infection 2010   Herpesviral infection    HPV (human papilloma virus) infection 07/28/2008   Hypertension    Leukocytopenia    Yeast infection     Patient Active Problem List   Diagnosis Date Noted   Other pancytopenia (HCC) 03/14/2014   Sjogren's disease 09/26/2011   Overweight 09/02/2011   Vaginosis 09/02/2011   CIN II (cervical intraepithelial neoplasia II) 09/02/2011   HPV (human papilloma virus) anogenital infection 09/02/2011   HSV-2 (herpes simplex virus 2) infection 09/02/2011   Cleft lip 09/02/2011   Depression    Hypertension    Leukocytopenia     Past Surgical History:  Procedure Laterality Date   LASER ABLATION OF THE CERVIX  07/28/2008    OB History     Gravida  1   Para  0   Term      Preterm      AB  1   Living         SAB      IAB  1   Ectopic      Multiple      Live Births               Home Medications     Prior to Admission medications   Medication Sig Start Date End Date Taking? Authorizing Provider  fluconazole  (DIFLUCAN ) 150 MG tablet Take 1 tablet (150 mg total) by mouth daily. (May repeat in 3 days if needed) 02/22/24  Yes Janet Therisa PARAS, FNP  nitrofurantoin , macrocrystal-monohydrate, (MACROBID ) 100 MG capsule Take 1 capsule (100 mg total) by mouth 2 (two) times daily. 02/22/24  Yes Janet Therisa PARAS, FNP  amLODipine (NORVASC) 5 MG tablet Take 5 mg by mouth daily. 10/12/17   [provider]  Ascorbic Acid (VITAMIN C PO) Take 1 tablet by mouth daily.    [provider]  Cetirizine HCl (ZYRTEC PO) Take 1 tablet by mouth daily.    [provider]  Cholecalciferol (VITAMIN D PO) Take 1 tablet by mouth daily.    [provider]  Cyanocobalamin  (B-12 PO) Take 1 tablet by mouth daily.    [provider]  levothyroxine (SYNTHROID, LEVOTHROID) 100 MCG tablet Take 100 mcg by mouth every morning. 10/15/17   [provider]  ondansetron  (ZOFRAN  ODT) 4 MG disintegrating tablet 4mg  ODT q4 hours prn nausea/vomit 11/01/17   Zammit, Joseph, MD  pantoprazole  (PROTONIX ) 20 MG tablet Take 1 tablet (20 mg total) by mouth daily. 11/01/17   Zammit, Joseph, MD  senna (SENOKOT) 8.6 MG tablet Take 2 tablets by mouth daily as needed for constipation.    [provider]    Family History Family History  Problem Relation Age of Onset   Hypertension Mother     Social History Social History   Tobacco Use   Smoking status: Former    Current packs/day: 0.00    Types: Cigarettes    Quit date: 04/01/2003    Years since quitting: 20.9   Smokeless tobacco: Never  Substance Use Topics   Alcohol use: Yes   Drug use: No     Allergies   Citalopram and Sulfamethoxazole-trimethoprim   Review of Systems Review of Systems  Constitutional:  Negative for chills and fever.  HENT:  Negative for congestion and sore throat.   Eyes:  Negative for pain and redness.   Respiratory:  Negative for cough and shortness of breath.   Cardiovascular:  Negative for chest pain.  Gastrointestinal:  Negative for abdominal pain, diarrhea and vomiting.  Genitourinary:  Positive for dysuria, frequency and urgency. Negative for vaginal bleeding and vaginal discharge.  Musculoskeletal:  Negative for back pain and myalgias.  Skin:  Negative for rash.  Neurological:  Negative for dizziness and headaches.  Psychiatric/Behavioral:  Negative for sleep disturbance.      Physical Exam Triage Vital Signs ED Triage Vitals  Encounter Vitals Group     BP 02/22/24 1151 (!) 157/88     Girls Systolic BP Percentile --      Girls Diastolic BP Percentile --      Boys Systolic BP Percentile --      Boys Diastolic BP Percentile --      Pulse Rate 02/22/24 1151 78     Resp 02/22/24 1151 15     Temp 02/22/24 1151 99 F (37.2 C)     Temp Source 02/22/24 1151 Oral     SpO2 02/22/24 1151 97 %     Weight --      Height --      Head Circumference --      Peak Flow --      Pain Score 02/22/24 1200 6     Pain Loc --      Pain Education --      Exclude from Growth Chart --    No data found.  Updated Vital Signs BP (!) 157/88 (BP Location: Left Arm)   Pulse 78   Temp 99 F (37.2 C) (Oral)   Resp 15   LMP 01/26/2024 (Exact Date)   SpO2 97%   Visual Acuity Right Eye Distance:   Left Eye Distance:   Bilateral Distance:    Right Eye Near:   Left Eye Near:    Bilateral Near:     Physical Exam Vitals and nursing note reviewed.  Constitutional:      Appearance: Normal appearance.  Cardiovascular:     Rate and Rhythm: Normal rate and regular rhythm.     Heart sounds: No murmur heard. Pulmonary:     Effort: Pulmonary effort is normal.     Breath sounds: Normal breath sounds. No wheezing, rhonchi or rales.  Genitourinary:    Comments: No CVA tenderness noted bilaterally. Musculoskeletal:        General: Normal range of motion.  Skin:    General: Skin is warm and  dry.  Neurological:     General:  No focal deficit present.     Mental Status: She is alert and oriented to person, place, and time.  Psychiatric:        Mood and Affect: Mood normal.        Behavior: Behavior normal.        Thought Content: Thought content normal.        Judgment: Judgment normal.      UC Treatments / Results  Labs (all labs ordered are listed, but only abnormal results are displayed) Labs Reviewed  POCT URINE DIPSTICK - Abnormal; Notable for the following components:      Result Value   Color, UA colorless (*)    Clarity, UA hazy (*)    Spec Grav, UA <=1.005 (*)    Blood, UA small (*)    Leukocytes, UA Small (1+) (*)    All other components within normal limits  URINE CULTURE    EKG   Radiology No results found.  Procedures Procedures (including critical care time)  Medications Ordered in UC Medications - No data to display  Initial Impression / Assessment and Plan / UC Course  I have reviewed the triage vital signs and the nursing notes.  Pertinent labs & imaging results that were available during my care of the patient were reviewed by me and considered in my medical decision making (see chart for details).    Patient presented requesting evaluation for a 1 week history of intermittent dysuria and urinary urgency.  On physical exam, she does not have any CVA tenderness bilaterally or overt low abdominal pain.  Her POCT UA does demonstrate blood and leukocytes-urine culture collected and currently pending.  Based upon her symptomology, reasonable to provide antibiotic therapy while awaiting those results.  Patient requested Diflucan  in the chance she develops a post antibiotic use vaginal candidiasis.  Discussed increased oral hydration May use OTC Azo as needed for symptom management.  Monitor closely for any new or worsening symptoms.  Final Clinical Impressions(s) / UC Diagnoses   Final diagnoses:  Dysuria  Abnormal urine findings      Discharge Instructions      Urine culture has been sent to further evaluate for a UTI Macrobid  has been sent to the pharmacy as antibiotic treatment Diflucan  to use if you develop a vaginal yeast infection Increase fluid intake May use Azo over the counter for symptom management     ED Prescriptions     Medication Sig Dispense Auth. Provider   nitrofurantoin , macrocrystal-monohydrate, (MACROBID ) 100 MG capsule Take 1 capsule (100 mg total) by mouth 2 (two) times daily. 10 capsule Janet Therisa PARAS, FNP   fluconazole  (DIFLUCAN ) 150 MG tablet Take 1 tablet (150 mg total) by mouth daily. (May repeat in 3 days if needed) 2 tablet Janet Therisa PARAS, FNP      PDMP not reviewed this encounter.   Janet Therisa PARAS, FNP 02/22/24 [phone]

## 2024-02-22 NOTE — ED Triage Notes (Signed)
 Pt present with on and off dysuria, abdominal pressure and urinary frequency x last night. Pt denies foul odor in urine and discoloration.   Home interventions: Naproxen

## 2024-02-22 NOTE — Discharge Instructions (Signed)
 Urine culture has been sent to further evaluate for a UTI Macrobid  has been sent to the pharmacy as antibiotic treatment Diflucan  to use if you develop a vaginal yeast infection Increase fluid intake May use Azo over the counter for symptom management

## 2024-02-24 ENCOUNTER — Ambulatory Visit: Payer: Self-pay

## 2024-02-24 LAB — URINE CULTURE: Culture: >=100000 — AB

## 2024-03-14 DIAGNOSIS — R350 Frequency of micturition: Secondary | ICD-10-CM | POA: Diagnosis not present

## 2024-03-14 DIAGNOSIS — R300 Dysuria: Secondary | ICD-10-CM | POA: Diagnosis not present

## 2024-03-17 DIAGNOSIS — Z01411 Encounter for gynecological examination (general) (routine) with abnormal findings: Secondary | ICD-10-CM | POA: Diagnosis not present

## 2024-03-17 DIAGNOSIS — Z113 Encounter for screening for infections with a predominantly sexual mode of transmission: Secondary | ICD-10-CM | POA: Diagnosis not present

## 2024-03-17 DIAGNOSIS — Z1231 Encounter for screening mammogram for malignant neoplasm of breast: Secondary | ICD-10-CM | POA: Diagnosis not present

## 2024-03-17 DIAGNOSIS — Z124 Encounter for screening for malignant neoplasm of cervix: Secondary | ICD-10-CM | POA: Diagnosis not present

## 2024-03-17 DIAGNOSIS — N760 Acute vaginitis: Secondary | ICD-10-CM | POA: Diagnosis not present

## 2024-03-28 DIAGNOSIS — M3501 Sicca syndrome with keratoconjunctivitis: Secondary | ICD-10-CM | POA: Diagnosis not present

## 2024-03-28 DIAGNOSIS — R5383 Other fatigue: Secondary | ICD-10-CM | POA: Diagnosis not present

## 2024-03-28 DIAGNOSIS — Z79899 Other long term (current) drug therapy: Secondary | ICD-10-CM | POA: Diagnosis not present

## 2024-03-28 DIAGNOSIS — M0579 Rheumatoid arthritis with rheumatoid factor of multiple sites without organ or systems involvement: Secondary | ICD-10-CM | POA: Diagnosis not present
